# Patient Record
Sex: Female | Born: 1995 | Race: White | Hispanic: No | Marital: Single | Smoking: Never smoker
Health system: Southern US, Community
[De-identification: ages and names within clinical notes are randomized; demographics above are authoritative.]

## PROBLEM LIST (undated history)

## (undated) ENCOUNTER — Inpatient Hospital Stay (HOSPITAL_COMMUNITY): Payer: Self-pay

## (undated) DIAGNOSIS — F419 Anxiety disorder, unspecified: Secondary | ICD-10-CM

## (undated) DIAGNOSIS — R1310 Dysphagia, unspecified: Secondary | ICD-10-CM

## (undated) DIAGNOSIS — F1910 Other psychoactive substance abuse, uncomplicated: Secondary | ICD-10-CM

## (undated) DIAGNOSIS — E559 Vitamin D deficiency, unspecified: Secondary | ICD-10-CM

## (undated) DIAGNOSIS — R12 Heartburn: Secondary | ICD-10-CM

## (undated) DIAGNOSIS — F32A Depression, unspecified: Secondary | ICD-10-CM

## (undated) DIAGNOSIS — Z789 Other specified health status: Secondary | ICD-10-CM

## (undated) DIAGNOSIS — R7303 Prediabetes: Secondary | ICD-10-CM

## (undated) DIAGNOSIS — R002 Palpitations: Secondary | ICD-10-CM

## (undated) HISTORY — DX: Vitamin D deficiency, unspecified: E55.9

## (undated) HISTORY — DX: Anxiety disorder, unspecified: F41.9

## (undated) HISTORY — DX: Heartburn: R12

## (undated) HISTORY — DX: Dysphagia, unspecified: R13.10

## (undated) HISTORY — DX: Other psychoactive substance abuse, uncomplicated: F19.10

## (undated) HISTORY — DX: Palpitations: R00.2

## (undated) HISTORY — DX: Depression, unspecified: F32.A

## (undated) HISTORY — PX: NO PAST SURGERIES: SHX2092

## (undated) HISTORY — DX: Prediabetes: R73.03

---
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
## 2014-10-05 ENCOUNTER — Emergency Department (HOSPITAL_COMMUNITY)
Admission: EM | Admit: 2014-10-05 | Discharge: 2014-10-05 | Discharge status: Absent without leave | Payer: Self-pay | Source: Home / Self Care

## 2014-11-07 ENCOUNTER — Ambulatory Visit: Payer: Medicaid Other | Admitting: Family Medicine

## 2015-07-01 ENCOUNTER — Encounter (HOSPITAL_COMMUNITY): Payer: Self-pay | Admitting: *Deleted

## 2015-07-01 ENCOUNTER — Other Ambulatory Visit: Payer: Self-pay

## 2015-07-01 ENCOUNTER — Emergency Department (HOSPITAL_COMMUNITY)
Admission: EM | Admit: 2015-07-01 | Discharge: 2015-07-01 | Disposition: A | Discharge status: Home / Self Care | Payer: Medicaid Other | Attending: Emergency Medicine | Admitting: Emergency Medicine

## 2015-07-01 DIAGNOSIS — M791 Myalgia: Secondary | ICD-10-CM | POA: Insufficient documentation

## 2015-07-01 DIAGNOSIS — J069 Acute upper respiratory infection, unspecified: Secondary | ICD-10-CM

## 2015-07-01 DIAGNOSIS — Z3202 Encounter for pregnancy test, result negative: Secondary | ICD-10-CM | POA: Insufficient documentation

## 2015-07-01 DIAGNOSIS — B9789 Other viral agents as the cause of diseases classified elsewhere: Secondary | ICD-10-CM

## 2015-07-01 DIAGNOSIS — Z720 Tobacco use: Secondary | ICD-10-CM | POA: Insufficient documentation

## 2015-07-01 LAB — URINALYSIS, ROUTINE W REFLEX MICROSCOPIC
Bilirubin Urine: NEGATIVE
GLUCOSE, UA: NEGATIVE mg/dL
Ketones, ur: NEGATIVE mg/dL
Leukocytes, UA: NEGATIVE
NITRITE: NEGATIVE
PH: 6 (ref 5.0–8.0)
PROTEIN: NEGATIVE mg/dL
SPECIFIC GRAVITY, URINE: 1.018 (ref 1.005–1.030)
UROBILINOGEN UA: 1 mg/dL (ref 0.0–1.0)

## 2015-07-01 LAB — CBC WITH DIFFERENTIAL/PLATELET
BASOS ABS: 0 10*3/uL (ref 0.0–0.1)
Basophils Relative: 0 % (ref 0–1)
EOS ABS: 0.3 10*3/uL (ref 0.0–0.7)
EOS PCT: 4 % (ref 0–5)
HCT: 41 % (ref 36.0–46.0)
Hemoglobin: 13.7 g/dL (ref 12.0–15.0)
Lymphocytes Relative: 14 % (ref 12–46)
Lymphs Abs: 1.1 10*3/uL (ref 0.7–4.0)
MCH: 31 pg (ref 26.0–34.0)
MCHC: 33.4 g/dL (ref 30.0–36.0)
MCV: 92.8 fL (ref 78.0–100.0)
Monocytes Absolute: 1.1 10*3/uL — ABNORMAL HIGH (ref 0.1–1.0)
Monocytes Relative: 14 % — ABNORMAL HIGH (ref 3–12)
Neutro Abs: 5.3 10*3/uL (ref 1.7–7.7)
Neutrophils Relative %: 68 % (ref 43–77)
PLATELETS: 281 10*3/uL (ref 150–400)
RBC: 4.42 MIL/uL (ref 3.87–5.11)
RDW: 13.6 % (ref 11.5–15.5)
WBC: 7.8 10*3/uL (ref 4.0–10.5)

## 2015-07-01 LAB — WET PREP, GENITAL
CLUE CELLS WET PREP: NONE SEEN
TRICH WET PREP: NONE SEEN
WBC WET PREP: NONE SEEN
YEAST WET PREP: NONE SEEN

## 2015-07-01 LAB — URINE MICROSCOPIC-ADD ON

## 2015-07-01 LAB — COMPREHENSIVE METABOLIC PANEL
ALT: 17 U/L (ref 14–54)
AST: 18 U/L (ref 15–41)
Albumin: 2.9 g/dL — ABNORMAL LOW (ref 3.5–5.0)
Alkaline Phosphatase: 65 U/L (ref 38–126)
Anion gap: 6 (ref 5–15)
BILIRUBIN TOTAL: 0.5 mg/dL (ref 0.3–1.2)
BUN: 9 mg/dL (ref 6–20)
CALCIUM: 8.5 mg/dL — AB (ref 8.9–10.3)
CO2: 27 mmol/L (ref 22–32)
Chloride: 106 mmol/L (ref 101–111)
Creatinine, Ser: 0.73 mg/dL (ref 0.44–1.00)
GFR calc non Af Amer: >60 mL/min (ref >60)
Glucose, Bld: 84 mg/dL (ref 65–99)
Potassium: 3.5 mmol/L (ref 3.5–5.1)
Sodium: 139 mmol/L (ref 135–145)
Total Protein: 5.3 g/dL — ABNORMAL LOW (ref 6.5–8.1)

## 2015-07-01 LAB — LIPASE, BLOOD: Lipase: 19 U/L — ABNORMAL LOW (ref 22–51)

## 2015-07-01 LAB — I-STAT CG4 LACTIC ACID, ED: LACTIC ACID, VENOUS: 1.27 mmol/L (ref 0.5–2.0)

## 2015-07-01 LAB — POC URINE PREG, ED: Preg Test, Ur: NEGATIVE

## 2015-07-01 MED ORDER — IBUPROFEN 800 MG PO TABS
800.0000 mg | ORAL_TABLET | Freq: Once | ORAL | Status: AC
  Administered 2015-07-01: 800 mg via ORAL
  Filled 2015-07-01: qty 1

## 2015-07-01 MED ORDER — SODIUM CHLORIDE 0.9 % IV BOLUS (SEPSIS)
2000.0000 mL | Freq: Once | INTRAVENOUS | Status: AC
  Administered 2015-07-01: 2000 mL via INTRAVENOUS

## 2015-07-01 NOTE — ED Notes (Signed)
The pt is c/o cold cough congestion chills body aches sore throat for 4 days.  lmp one week ago

## 2015-07-01 NOTE — Discharge Instructions (Signed)
Upper Respiratory Infection, Adult Alice Humphrey, take Motrin as needed for pain. Your blood work today was normal, you were hydrated with IV fluids. See a primary care physician within 3 days for close follow-up. If symptoms worsen come back to emergency department immediately. Thank you. An upper respiratory infection (URI) is also known as the common cold. It is often caused by a type of germ (virus). Colds are easily spread (contagious). You can pass it to others by kissing, coughing, sneezing, or drinking out of the same glass. Usually, you get better in 1 or 2 weeks.  HOME CARE   Only take medicine as told by your doctor.  Use a warm mist humidifier or breathe in steam from a hot shower.  Drink enough water and fluids to keep your pee (urine) clear or pale yellow.  Get plenty of rest.  Return to work when your temperature is back to normal or as told by your doctor. You may use a face mask and wash your hands to stop your cold from spreading. GET HELP RIGHT AWAY IF:   After the first few days, you feel you are getting worse.  You have questions about your medicine.  You have chills, shortness of breath, or brown or red spit (mucus).  You have yellow or brown snot (nasal discharge) or pain in the face, especially when you bend forward.  You have a fever, puffy (swollen) neck, pain when you swallow, or white spots in the back of your throat.  You have a bad headache, ear pain, sinus pain, or chest pain.  You have a high-pitched whistling sound when you breathe in and out (wheezing).  You have a lasting cough or cough up blood.  You have sore muscles or a stiff neck. MAKE SURE YOU:   Understand these instructions.  Will watch your condition.  Will get help right away if you are not doing well or get worse. Document Released: 05/26/2008 Document Revised: 03/01/2012 Document Reviewed: 03/15/2014 [facility] Patient Information 2015 [employer], [place]. This information is not intended  to replace advice given to you by your health care provider. Make sure you discuss any questions you have with your health care provider.

## 2015-07-01 NOTE — ED Notes (Signed)
Patient is alert and orientedx4.  Patient was explained discharge instructions and they understood them with no questions.   

## 2015-07-01 NOTE — ED Provider Notes (Signed)
CSN: [id number]     Arrival date & time 07/01/15  0015 History  This chart was scribed for Tomasita CrumbleAdeleke Atilla Zollner, MD by Evon Slackerrance Branch, ED Scribe. This patient was seen in room D34C/D34C and the patient's care was started at 12:25 AM.    Chief Complaint  Patient presents with  . Generalized Body Aches    Patient is a 19 y.o. female presenting with cough. The history is provided by the patient. No language interpreter was used.  Cough Severity:  Moderate Onset quality:  Gradual Duration:  3 days Chronicity:  New Relieved by:  None tried Worsened by:  Nothing tried Ineffective treatments:  None tried Associated symptoms: chills, headaches, myalgias, sinus congestion and sore throat    HPI Comments: Alice Humphrey is a 19 y.o. female who presents to the Emergency Department complaining of cough onset 3 days prior. Pt states she has associated sore throat, congestion, chills, generalized myalgias and HA. Pt is also complaining of intermittent abdominal cramps.  Pt doesn't report any medications PTA. Pt doesn't repot any alleviating factors. Pt denies appetite change, fever, nausea, vomiting,  diarrhea dysuria hematuria, vaginal bleeding or vaginal discharge.   History reviewed. No pertinent past medical history. History reviewed. No pertinent past surgical history. No family history on file. History  Substance Use Topics  . Smoking status: Current Every Day Smoker  . Smokeless tobacco: Not on file  . Alcohol Use: Yes   OB History    No data available      Review of Systems  Constitutional: Positive for chills.  HENT: Positive for congestion and sore throat.   Respiratory: Positive for cough.   Gastrointestinal: Positive for abdominal pain.  Musculoskeletal: Positive for myalgias.  Neurological: Positive for headaches.     Allergies  Review of patient's allergies indicates no known allergies.  Home Medications   Prior to Admission medications   Not on File   BP 122/75 mmHg   Pulse 128  Temp(Src) 98.1 F (36.7 C) (Oral)  Resp 18  Ht 4\' 11"  (1.499 m)  Wt 173 lb 5 oz (78.614 kg)  BMI 34.99 kg/m2  SpO2 99%  LMP 06/24/2015   Physical Exam  Constitutional: She is oriented to person, place, and time. She appears well-developed and well-nourished. No distress.  HENT:  Head: Normocephalic and atraumatic.  Nose: Nose normal.  Mouth/Throat: Oropharynx is clear and moist. No oropharyngeal exudate.  Eyes: Conjunctivae and EOM are normal. Pupils are equal, round, and reactive to light. No scleral icterus.  Neck: Normal range of motion. Neck supple. No JVD present. No tracheal deviation present. No thyromegaly present.  Cardiovascular: Normal rate, regular rhythm and normal heart sounds.  Exam reveals no gallop and no friction rub.   No murmur heard. Pulmonary/Chest: Effort normal and breath sounds normal. No respiratory distress. She has no wheezes. She exhibits no tenderness.  Abdominal: Soft. Bowel sounds are normal. She exhibits no distension and no mass. There is tenderness in the suprapubic area. There is no rebound and no guarding.  Genitourinary: Vagina normal and uterus normal. No vaginal discharge found.  No CMT or adnexal tenderness.  Musculoskeletal: Normal range of motion. She exhibits no edema or tenderness.  Lymphadenopathy:    She has no cervical adenopathy.  Neurological: She is alert and oriented to person, place, and time. No cranial nerve deficit. She exhibits normal muscle tone.  Skin: Skin is warm and dry. No rash noted. No erythema. No pallor.  Nursing note and vitals reviewed.  ED Course  Procedures (including critical care time) DIAGNOSTIC STUDIES: Oxygen Saturation is 99% on RA, normal by my interpretation.    COORDINATION OF CARE: 12:54 AM-Discussed treatment plan with pt at bedside and pt agreed to plan.      Labs Review Labs Reviewed  CBC WITH DIFFERENTIAL/PLATELET - Abnormal; Notable for the following:    Monocytes Relative  14 (*)    Monocytes Absolute 1.1 (*)    All other components within normal limits  COMPREHENSIVE METABOLIC PANEL - Abnormal; Notable for the following:    Calcium 8.5 (*)    Total Protein 5.3 (*)    Albumin 2.9 (*)    All other components within normal limits  LIPASE, BLOOD - Abnormal; Notable for the following:    Lipase 19 (*)    All other components within normal limits  URINALYSIS, ROUTINE W REFLEX MICROSCOPIC (NOT AT [facility]) - Abnormal; Notable for the following:    Hgb urine dipstick SMALL (*)    All other components within normal limits  WET PREP, GENITAL  URINE MICROSCOPIC-ADD ON  POC URINE PREG, ED  I-STAT CG4 LACTIC ACID, ED  GC/CHLAMYDIA PROBE AMP ([place]) NOT AT [facility]    Imaging Review No results found.   EKG Interpretation None      MDM   Final diagnoses:  None    Patient presents emergency department for viral URI like symptoms. For the last several days she has had cough, congestion, body aches. She is initially tachycardic to the 130s, this resolved after 1 L of IV fluids. She was given Motrin and states her abdominal pain and congestion has improved. Laboratory studies, urinalysis, and pelvic exam were all unremarkable. This is likely viral infection. Patient was advised to see primary care physician within 3 days. She appears well and in no acute distress, her vital signs were within her normal limits and she is safe for discharge.   I personally performed the services described in this documentation, which was scribed in my presence. The recorded information has been reviewed and is accurate.     Tomasita Crumble, MD 07/01/15 0200

## 2015-07-02 LAB — GC/CHLAMYDIA PROBE AMP (~~LOC~~) NOT AT ARMC
CHLAMYDIA, DNA PROBE: NEGATIVE
Neisseria Gonorrhea: NEGATIVE

## 2015-08-15 ENCOUNTER — Encounter (HOSPITAL_COMMUNITY): Payer: Self-pay

## 2015-08-15 ENCOUNTER — Emergency Department (HOSPITAL_COMMUNITY)
Admission: EM | Admit: 2015-08-15 | Discharge: 2015-08-16 | Disposition: A | Discharge status: Home / Self Care | Payer: Medicaid Other | Attending: Emergency Medicine | Admitting: Emergency Medicine

## 2015-08-15 DIAGNOSIS — R109 Unspecified abdominal pain: Secondary | ICD-10-CM

## 2015-08-15 DIAGNOSIS — Z720 Tobacco use: Secondary | ICD-10-CM | POA: Insufficient documentation

## 2015-08-15 DIAGNOSIS — R110 Nausea: Secondary | ICD-10-CM | POA: Insufficient documentation

## 2015-08-15 DIAGNOSIS — R1030 Lower abdominal pain, unspecified: Secondary | ICD-10-CM | POA: Insufficient documentation

## 2015-08-15 DIAGNOSIS — Z3202 Encounter for pregnancy test, result negative: Secondary | ICD-10-CM | POA: Insufficient documentation

## 2015-08-15 DIAGNOSIS — Z793 Long term (current) use of hormonal contraceptives: Secondary | ICD-10-CM | POA: Insufficient documentation

## 2015-08-15 DIAGNOSIS — R197 Diarrhea, unspecified: Secondary | ICD-10-CM | POA: Insufficient documentation

## 2015-08-15 DIAGNOSIS — R42 Dizziness and giddiness: Secondary | ICD-10-CM | POA: Insufficient documentation

## 2015-08-15 LAB — URINALYSIS, ROUTINE W REFLEX MICROSCOPIC
Bilirubin Urine: NEGATIVE
Glucose, UA: NEGATIVE mg/dL
HGB URINE DIPSTICK: NEGATIVE
Ketones, ur: NEGATIVE mg/dL
Leukocytes, UA: NEGATIVE
NITRITE: NEGATIVE
Protein, ur: NEGATIVE mg/dL
SPECIFIC GRAVITY, URINE: 1.022 (ref 1.005–1.030)
Urobilinogen, UA: 0.2 mg/dL (ref 0.0–1.0)
pH: 5.5 (ref 5.0–8.0)

## 2015-08-15 LAB — CBC
HEMATOCRIT: 44.5 % (ref 36.0–46.0)
Hemoglobin: 14.8 g/dL (ref 12.0–15.0)
MCH: 31.6 pg (ref 26.0–34.0)
MCHC: 33.3 g/dL (ref 30.0–36.0)
MCV: 94.9 fL (ref 78.0–100.0)
Platelets: 318 10*3/uL (ref 150–400)
RBC: 4.69 MIL/uL (ref 3.87–5.11)
RDW: 13.8 % (ref 11.5–15.5)
WBC: 14.7 10*3/uL — ABNORMAL HIGH (ref 4.0–10.5)

## 2015-08-15 NOTE — ED Notes (Signed)
Pt here for abd pain cramping, nausea. No vomiting. Does report some diarrhea yesterday. No vaginal discharge or bleeding.

## 2015-08-16 LAB — COMPREHENSIVE METABOLIC PANEL
ALBUMIN: 2.7 g/dL — AB (ref 3.5–5.0)
ALT: 18 U/L (ref 14–54)
AST: 16 U/L (ref 15–41)
Alkaline Phosphatase: 49 U/L (ref 38–126)
Anion gap: 5 (ref 5–15)
BUN: <5 mg/dL — ABNORMAL LOW (ref 6–20)
CHLORIDE: 107 mmol/L (ref 101–111)
CO2: 28 mmol/L (ref 22–32)
Calcium: 8.3 mg/dL — ABNORMAL LOW (ref 8.9–10.3)
Creatinine, Ser: 0.66 mg/dL (ref 0.44–1.00)
GFR calc Af Amer: >60 mL/min (ref >60)
GFR calc non Af Amer: >60 mL/min (ref >60)
GLUCOSE: 96 mg/dL (ref 65–99)
POTASSIUM: 4.4 mmol/L (ref 3.5–5.1)
SODIUM: 140 mmol/L (ref 135–145)
TOTAL PROTEIN: 5 g/dL — AB (ref 6.5–8.1)
Total Bilirubin: 0.4 mg/dL (ref 0.3–1.2)

## 2015-08-16 LAB — PREGNANCY, URINE: PREG TEST UR: NEGATIVE

## 2015-08-16 LAB — LIPASE, BLOOD: LIPASE: 21 U/L — AB (ref 22–51)

## 2015-08-16 MED ORDER — DICYCLOMINE HCL 20 MG PO TABS: 20.0000 mg | ORAL_TABLET | Freq: Two times a day (BID) | ORAL | Status: DC

## 2015-08-16 MED ORDER — IBUPROFEN 800 MG PO TABS: 800.0000 mg | ORAL_TABLET | Freq: Three times a day (TID) | ORAL | Status: DC

## 2015-08-16 MED ORDER — DICYCLOMINE HCL 10 MG PO CAPS
20.0000 mg | ORAL_CAPSULE | Freq: Once | ORAL | Status: AC
  Administered 2015-08-16: 20 mg via ORAL
  Filled 2015-08-16: qty 2

## 2015-08-16 NOTE — Discharge Instructions (Signed)
1. Medications: bentyl, ibuprofen, usual home medications 2. Treatment: rest, drink plenty of fluids 3. Follow Up: please followup with your primary doctor this week for discussion of your diagnoses and further evaluation after today's visit; if you do not have a primary care doctor use the resource guide provided to find one; please return to the ER for severe abdominal pain, vomiting, blood in stool, fever, chills, new or worsening symptoms   Abdominal Pain, Women Abdominal (stomach, pelvic, or belly) pain can be caused by many things. It is important to tell your doctor:  The location of the pain.  Does it come and go or is it present all the time?  Are there things that start the pain (eating certain foods, exercise)?  Are there other symptoms associated with the pain (fever, nausea, vomiting, diarrhea)? All of this is helpful to know when trying to find the cause of the pain. CAUSES   Stomach: virus or bacteria infection, or ulcer.  Intestine: appendicitis (inflamed appendix), regional ileitis (Crohn's disease), ulcerative colitis (inflamed colon), irritable bowel syndrome, diverticulitis (inflamed diverticulum of the colon), or cancer of the stomach or intestine.  Gallbladder disease or stones in the gallbladder.  Kidney disease, kidney stones, or infection.  Pancreas infection or cancer.  Fibromyalgia (pain disorder).  Diseases of the female organs:  Uterus: fibroid (non-cancerous) tumors or infection.  Fallopian tubes: infection or tubal pregnancy.  Ovary: cysts or tumors.  Pelvic adhesions (scar tissue).  Endometriosis (uterus lining tissue growing in the pelvis and on the pelvic organs).  Pelvic congestion syndrome (female organs filling up with blood just before the menstrual period).  Pain with the menstrual period.  Pain with ovulation (producing an egg).  Pain with an IUD (intrauterine device, birth control) in the uterus.  Cancer of the female  organs.  Functional pain (pain not caused by a disease, may improve without treatment).  Psychological pain.  Depression. DIAGNOSIS  Your doctor will decide the seriousness of your pain by doing an examination.  Blood tests.  X-rays.  Ultrasound.  CT scan (computed tomography, special type of X-ray).  MRI (magnetic resonance imaging).  Cultures, for infection.  Barium enema (dye inserted in the large intestine, to better view it with X-rays).  Colonoscopy (looking in intestine with a lighted tube).  Laparoscopy (minor surgery, looking in abdomen with a lighted tube).  Major abdominal exploratory surgery (looking in abdomen with a large incision). TREATMENT  The treatment will depend on the cause of the pain.   Many cases can be observed and treated at home.  Over-the-counter medicines recommended by your caregiver.  Prescription medicine.  Antibiotics, for infection.  Birth control pills, for painful periods or for ovulation pain.  Hormone treatment, for endometriosis.  Nerve blocking injections.  Physical therapy.  Antidepressants.  Counseling with a psychologist or psychiatrist.  Minor or major surgery. HOME CARE INSTRUCTIONS   Do not take laxatives, unless directed by your caregiver.  Take over-the-counter pain medicine only if ordered by your caregiver. Do not take aspirin because it can cause an upset stomach or bleeding.  Try a clear liquid diet (broth or water) as ordered by your caregiver. Slowly move to a bland diet, as tolerated, if the pain is related to the stomach or intestine.  Have a thermometer and take your temperature several times a day, and record it.  Bed rest and sleep, if it helps the pain.  Avoid sexual intercourse, if it causes pain.  Avoid stressful situations.  Keep your follow-up  appointments and tests, as your caregiver orders.  If the pain does not go away with medicine or surgery, you may  try:  Acupuncture.  Relaxation exercises (yoga, meditation).  Group therapy.  Counseling. SEEK MEDICAL CARE IF:   You notice certain foods cause stomach pain.  Your home care treatment is not helping your pain.  You need stronger pain medicine.  You want your IUD removed.  You feel faint or lightheaded.  You develop nausea and vomiting.  You develop a rash.  You are having side effects or an allergy to your medicine. SEEK IMMEDIATE MEDICAL CARE IF:   Your pain does not go away or gets worse.  You have a fever.  Your pain is felt only in portions of the abdomen. The right side could possibly be appendicitis. The left lower portion of the abdomen could be colitis or diverticulitis.  You are passing blood in your stools (bright red or black tarry stools, with or without vomiting).  You have blood in your urine.  You develop chills, with or without a fever.  You pass out. MAKE SURE YOU:   Understand these instructions.  Will watch your condition.  Will get help right away if you are not doing well or get worse. Document Released: 10/05/2007 Document Revised: 04/24/2014 Document Reviewed: 10/25/2009 [facility] Patient Information 2015 [place], [place]. This information is not intended to replace advice given to you by your health care provider. Make sure you discuss any questions you have with your health care provider.   Emergency Department Resource Guide 1) Find a Doctor and Pay Out of Pocket Although you won't have to find out who is covered by your insurance plan, it is a good idea to ask around and get recommendations. You will then need to call the office and see if the doctor you have chosen will accept you as a new patient and what types of options they offer for patients who are self-pay. Some doctors offer discounts or will set up payment plans for their patients who do not have insurance, but you will need to ask so you aren't surprised when you get to your  appointment.  2) Contact Your Local Health Department Not all health departments have doctors that can see patients for sick visits, but many do, so it is worth a call to see if yours does. If you don't know where your local health department is, you can check in your phone book. The CDC also has a tool to help you locate your state's health department, and many state websites also have listings of all of their local health departments.  3) Find a Walk-in Clinic If your illness is not likely to be very severe or complicated, you may want to try a walk in clinic. These are popping up all over the country in pharmacies, drugstores, and shopping centers. They're usually staffed by nurse practitioners or physician assistants that have been trained to treat common illnesses and complaints. They're usually fairly quick and inexpensive. However, if you have serious medical issues or chronic medical problems, these are probably not your best option.  No Primary Care Doctor: - Call Health Connect at  [phone] - they can help you locate a primary care doctor that  accepts your insurance, provides certain services, etc. - Physician Referral Service- [phone]  Chronic Pain Problems: Organization         Address  Phone   Notes  [facility]  [phone] Patients need to  be referred by their primary care doctor.   Medication Assistance: Organization         Address  Phone   Notes  [facility] Medication [facility] [address] [place], [place] [address] [phone] --Must be a resident of [facility] -- Must have NO insurance coverage whatsoever (no Medicaid/ Medicare, etc.) -- The pt. MUST have a primary care doctor that directs their care regularly and follows them in the community   [facility]  [phone]   [employer]  [phone]    Agencies that provide inexpensive medical care: Organization         Address  Phone   Notes  [facility]  [phone]   [facility]    [phone]   [facility] [address] [place], [place] [address] [phone]   [facility] [address] [place], [place] [phone]   Planned Parenthood    [phone]   [facility]    [phone]   [facility]  [address], [place] Phone:  [phone], Fax:  [phone] Hours of Operation:  9 am - 6 pm, M-F.  Also accepts Medicaid/Medicare and self-pay.  [facility]  [address], [place] Phone: [phone], Fax: [phone]. Hours of Operation:  8:30 am - 5:30 pm, M-F.  Also accepts Medicaid and self-pay.  [facility] [place] [address] [place] Phone: [phone]   [facility] [address] Natasha Bence [place], [place] [phone], Ext. [phone] Mondays & Thursdays: 7-9 AM.  First 15 patients are seen on a first come, first serve basis.    Medicaid-accepting [facility] Providers:  Organization         Address  Phone   Notes  [facility] [address], [place] [phone] Also accepts self-pay patients.  [facility] [address] Laurell Josephs [place], [place]  [phone]   [facility] [address], [place] [phone]   [facility] [address] [place] [phone]   Renaye Rakers [address], [place]   [phone] Only accepts [place] Access [place] patients after they have their name applied to their card.   Self-Pay (no insurance) in [facility]:  Organization         Address  Phone   Notes  Sickle Cell Patients, [facility] [address] [place], [place] [phone]   [facility] [address] [place], [place] [phone]   [facility]  [place]  [address], [place] [phone]   [facility]/Dr. Osei-Bonsu  [address], [place] or [address], [place] [phone] Phone number for both [place] and [place] locations is the same.  [facility] [address], [place] [phone]   [facility] [address] [place] or [address] [phone]   [facility] [address] [place] [phone], phone; [phone], fax Sees patients 1st and 3rd Saturday of every month.  Must not qualify for public or private insurance (i.e. Medicaid, Medicare,  Health Choice, Veterans' Benefits)  Household income should be  no more than 200% of the poverty level The clinic cannot treat you if you are pregnant or think you are pregnant  Sexually transmitted diseases are not treated at the clinic.    Dental Care: Organization         Address  Phone  Notes  [facility] of [facility] [address] [place], [place] [phone] Accepts children up to age 19 who are enrolled in [place] or [place] Health Choice; pregnant women with a Medicaid card; and children who have applied for Medicaid or [place] Health Choice, but were declined, whose parents can pay a reduced fee at time of service.  [facility] Department of [facility]  [address], [place] [phone] Accepts children up to age 7 who are enrolled in [place] or [place] Health Choice; pregnant women with a Medicaid card; and children who have applied for Medicaid or [place] Health Choice, but were declined, whose parents can pay a reduced fee at time of service.  [facility]  [address] [place], [place] [phone] Patients are seen by appointment only. Walk-ins are not accepted. [facility] will see patients 73 years of age and  older. Monday - Tuesday (8am-5pm) Most Wednesdays (8:30-5pm) $30 per visit, cash only  [facility] Adult Dental Access PROGRAM  [address], [facility] [phone] Patients are seen by appointment only. Walk-ins are not accepted. [facility] will see patients 26 years of age and older. One Wednesday Evening (Monthly: Volunteer Based).  $30 per visit, cash only  [employer]  [phone] for adults; Children under age 5, call Graduate Pediatric Dentistry at [phone]. Children aged 17-14, please call [phone] to request a pediatric application.  Dental services are provided in all areas of dental care including fillings, crowns and bridges, complete and partial dentures, implants, gum treatment, root canals, and extractions. Preventive care is also provided. Treatment is provided to both adults and children. Patients are selected via a lottery and there is often a waiting list.   [facility] [address], [place]  [phone] [email or web address]   [facility] [address] [place], [place] [phone], Ext. [phone] Second and Fourth Thursday of each month, opens at 6:30 AM; Clinic ends at 9 AM.  Patients are seen on a first-come first-served basis, and a limited number are seen during each clinic.   [facility]  [address] Ether Griffins [place], [place] [phone]   Eligibility Requirements You must have lived in [place], [place], or [place] counties for at least the last three months.   You cannot be eligible for state or federal sponsored [place], including [employer], [place], or [employer].   You generally cannot be eligible for healthcare insurance through your employer.    How to apply: Eligibility screenings are held every Tuesday and Wednesday afternoon from 1:00 pm until 4:00 pm. You do not need an appointment for the interview!  [facility] [address]  [place], [place] [phone]   [facility] Health Department  [phone]   [facility] Health Department  [phone]   [facility]  [phone]    Behavioral Health Resources in the Community: Intensive Outpatient Programs Organization         Address  Phone  Notes  [facility] [address] [place], [place] [phone]  [facility] [address] [place], [place]   [facility]: [facility] [address], [place], [place]   [place] [address]  [place], [place] or [phone]   Substance Abuse Resources Organization         Address  Phone  Notes  Alcohol and Drug Services  [phone]   [place]  [phone]   [facility] [place]   Chinita Pester  [phone]   Residential & Outpatient Substance Abuse Program  [phone]   Psychological Services Organization         Address  Phone  Notes  [facility] [place]  [place]  [phone]   [place] [address], [place] or [phone]    Mobile Crisis Teams Organization         Address  Phone  Notes  [facility], Mobile Crisis Care Unit  [phone]   [facility]  [address] [place], [place]   [place] [address] [place] [phone]    Self-Help/Support Groups Organization         Address  Phone             Notes  [place] of [place] - variety of support groups  [place] Call for more information  Narcotics Anonymous (NA), [facility]aring [facility] [address], [employer] [place]  2 meetings at this location   Special educational needs teacher         Address  Phone  Notes  [facility] [place],    [place]  [phone]   [facility]  [address] [place] [address]88325, [place], [place]   [place], [place] [phone] Admissions: 8am-3pm M-F  Incentives Substance [place] [address],    [place], [place] [phone]   [facility] [address] [place], [place], [place]   [facility] [address],  [place], [place]   [facility] - Intensive Outpatient [address] Kristeen Mans [address], [place], [place]   [facility] ([place].,  [place], [place] [phone] or [phone]   [facility] (RTS) [address], [place], [place] Accepts Medicaid  [facility] [place] [address]  [place] [phone] Substance Abuse/Addiction Treatment   [facility] Organization         Address  Phone  Notes  [facility]  [phone]   Domenic Schwab, PhD [address] Arlis Porta [place], [place]   [phone] or [phone]   [place], [place] [phone]   [facility] [address], [place], [place] [phone] Insurance/Medicaid/sponsorship through [facility] [place] and Families [address] [place]                                    [place], [place] [phone] [place] [address], [place] [phone]    Dr. Adele Schilder  [phone]   [facility] Dept. 1) [address] [place] 2) [place] 3)  [place], [place] [phone]  [phone]   [facility] Child Abuse Hotline [phone] or [phone] (After Hours)

## 2015-08-16 NOTE — ED Provider Notes (Signed)
CSN: [id number]     Arrival date & time 08/15/15  2247 History   First MD Initiated Contact with Patient 08/16/15 0024     Chief Complaint  Patient presents with  . Abdominal Pain    HPI   Alice Humphrey is a 19 y.o. female with no significant PMH of who presents to the ED with lower abdominal pain x 2 weeks. She states her abdominal pain comes and goes throughout the day and lasts for about 10 minutes at a time. She reports her pain feels like cramping. She is unable to identify anything that precipitates her pain and reports she has tried ibuprofen for pain relief. She reports nausea, and states she feels lightheaded when she is nauseous. She denies fever, chills, syncope, dysuria, urgency, frequency, vaginal discharge, bleeding. She denies constipation. She reports she had diarrhea yesterday. LMP was August 7th. States it was relatively normal for her, though it has been longer in duration since her IUD placement in June. She states she is sexually active with one partner and is not concerned for STDs.   History reviewed. No pertinent past medical history. History reviewed. No pertinent past surgical history. History reviewed. No pertinent family history. Social History  Substance Use Topics  . Smoking status: Current Every Day Smoker  . Smokeless tobacco: None  . Alcohol Use: Yes   OB History    No data available      Review of Systems  Constitutional: Negative for fever, chills, activity change, appetite change and fatigue.  Eyes: Negative for visual disturbance.  Respiratory: Negative for shortness of breath.   Cardiovascular: Negative for chest pain, palpitations and leg swelling.  Gastrointestinal: Positive for nausea, abdominal pain and diarrhea. Negative for vomiting, constipation and abdominal distention.  Genitourinary: Negative for dysuria, urgency, frequency, hematuria, flank pain, vaginal bleeding, vaginal discharge, vaginal pain and pelvic pain.  Musculoskeletal:  Negative for myalgias, back pain, arthralgias, neck pain and neck stiffness.  Skin: Negative for color change, pallor, rash and wound.  Neurological: Positive for light-headedness. Negative for dizziness, syncope, weakness, numbness and headaches.  All other systems reviewed and are negative.   Allergies  Review of patient's allergies indicates no known allergies.  Home Medications   Prior to Admission medications   Medication Sig Start Date End Date Taking? Authorizing Provider  levonorgestrel (MIRENA) 20 MCG/24HR IUD 1 each by Intrauterine route once.    Historical Provider, MD    BP 102/49 mmHg  Pulse 70  Temp(Src) 98.5 F (36.9 C) (Oral)  Resp 18  SpO2 99%  LMP 07/29/2015 Physical Exam  Constitutional: She is oriented to person, place, and time. Vital signs are normal. She appears well-developed and well-nourished. No distress.  HENT:  Head: Normocephalic and atraumatic.  Right Ear: External ear normal.  Left Ear: External ear normal.  Nose: Nose normal.  Mouth/Throat: Uvula is midline, oropharynx is clear and moist and mucous membranes are normal. No oropharyngeal exudate.  Eyes: Conjunctivae, EOM and lids are normal. Pupils are equal, round, and reactive to light. Right eye exhibits no discharge. Left eye exhibits no discharge. No scleral icterus.  Neck: Normal range of motion. Neck supple.  Cardiovascular: Normal rate, regular rhythm, normal heart sounds, intact distal pulses and normal pulses.   Pulmonary/Chest: Effort normal and breath sounds normal. No respiratory distress. She has no wheezes. She has no rales.  Abdominal: Soft. Normal appearance and bowel sounds are normal. She exhibits no distension and no mass. There is tenderness. There is no  rigidity, no rebound, no guarding and no CVA tenderness.  Mild tenderness to palpation in periumbilical region of abdomen. No rebound, guarding, or masses.  Musculoskeletal: Normal range of motion. She exhibits no edema or  tenderness.  Lymphadenopathy:    She has no cervical adenopathy.  Neurological: She is alert and oriented to person, place, and time. She has normal strength.  Skin: Skin is warm, dry and intact. No rash noted. She is not diaphoretic. No erythema. No pallor.  Psychiatric: She has a normal mood and affect. Her speech is normal and behavior is normal. Judgment and thought content normal.  Nursing note and vitals reviewed.   ED Course  Procedures (including critical care time)  Labs Review Labs Reviewed  LIPASE, BLOOD - Abnormal; Notable for the following:    Lipase 21 (*)    All other components within normal limits  COMPREHENSIVE METABOLIC PANEL - Abnormal; Notable for the following:    BUN <5 (*)    Calcium 8.3 (*)    Total Protein 5.0 (*)    Albumin 2.7 (*)    All other components within normal limits  CBC - Abnormal; Notable for the following:    WBC 14.7 (*)    All other components within normal limits  URINALYSIS, ROUTINE W REFLEX MICROSCOPIC (NOT AT [facility]) - Abnormal; Notable for the following:    APPearance HAZY (*)    All other components within normal limits  PREGNANCY, URINE    Imaging Review No results found.   I have personally reviewed and evaluated these lab results as part of my medical decision-making.   EKG Interpretation None      MDM   Final diagnoses:  None    19 year old female presents with intermittent, cramping abdominal pain x 2 weeks and associated nausea. Reports diarrhea x 1 day. Denies vomiting, constipation. Denies fever, chills, dysuria, urgency, frequency, vaginal discharge.   Patient is afebrile. Vital signs stable. No tachycardia or tachypnea. Mild tenderness to palpation of periumbilical region of abdomen on exam, no rebound, guarding, or masses. Patient is non-toxic and well-appearing.   CBC with mild leukocytosis at 14.7. Electrolytes on CMP within normal limits. Lipase 21. UA negative for infection. Urine pregnancy  negative.  Bentyl given with subsequent improvement in symptoms. Repeat abdominal exam benign.  Doubt acute intra-abdominal process as etiology of patient's symptoms given 2 week duration of pain, very mild tenderness to palpation of periumbilical region of abdomen on exam with no peritoneal signs, and negative work-up.  Return precautions discussed. Patient to follow-up with PCP.  BP 102/49 mmHg  Pulse 70  Temp(Src) 98.5 F (36.9 C) (Oral)  Resp 18  SpO2 99%  LMP 07/29/2015     Mady Gemma, PA-C 08/16/15 1610  Loren Racer, MD 08/16/15 [phone]

## 2015-08-16 NOTE — ED Notes (Signed)
PA at bedside.

## 2015-08-29 ENCOUNTER — Emergency Department (HOSPITAL_COMMUNITY)
Admission: EM | Admit: 2015-08-29 | Discharge: 2015-08-29 | Disposition: A | Discharge status: Home / Self Care | Payer: Medicaid Other | Attending: Emergency Medicine | Admitting: Emergency Medicine

## 2015-08-29 ENCOUNTER — Encounter (HOSPITAL_COMMUNITY): Payer: Self-pay | Admitting: *Deleted

## 2015-08-29 DIAGNOSIS — Z720 Tobacco use: Secondary | ICD-10-CM | POA: Insufficient documentation

## 2015-08-29 DIAGNOSIS — Z79899 Other long term (current) drug therapy: Secondary | ICD-10-CM | POA: Insufficient documentation

## 2015-08-29 DIAGNOSIS — N390 Urinary tract infection, site not specified: Secondary | ICD-10-CM | POA: Insufficient documentation

## 2015-08-29 DIAGNOSIS — Z3202 Encounter for pregnancy test, result negative: Secondary | ICD-10-CM | POA: Insufficient documentation

## 2015-08-29 LAB — URINALYSIS, ROUTINE W REFLEX MICROSCOPIC
GLUCOSE, UA: NEGATIVE mg/dL
Ketones, ur: 15 mg/dL — AB
Nitrite: NEGATIVE
PROTEIN: 30 mg/dL — AB
Specific Gravity, Urine: 1.026 (ref 1.005–1.030)
Urobilinogen, UA: 1 mg/dL (ref 0.0–1.0)
pH: 7 (ref 5.0–8.0)

## 2015-08-29 LAB — URINE MICROSCOPIC-ADD ON

## 2015-08-29 LAB — POC URINE PREG, ED: PREG TEST UR: NEGATIVE

## 2015-08-29 MED ORDER — CEPHALEXIN 500 MG PO CAPS: 500.0000 mg | ORAL_CAPSULE | Freq: Four times a day (QID) | ORAL | Status: DC

## 2015-08-29 MED ORDER — CEPHALEXIN 250 MG PO CAPS
500.0000 mg | ORAL_CAPSULE | Freq: Once | ORAL | Status: AC
  Administered 2015-08-29: 500 mg via ORAL
  Filled 2015-08-29: qty 2

## 2015-08-29 NOTE — ED Provider Notes (Signed)
CSN: [id number]     Arrival date & time 08/29/15  1945 History   First MD Initiated Contact with Patient 08/29/15 2107     Chief Complaint  Patient presents with  . Dysuria     (Consider location/radiation/quality/duration/timing/severity/associated sxs/prior Treatment) Patient is a 19 y.o. female presenting with dysuria. The history is provided by the patient. No language interpreter was used.  Dysuria Pain quality:  Burning Pain severity:  Moderate Onset quality:  Gradual Duration:  1 week Timing:  Constant Progression:  Unchanged Chronicity:  New Recent urinary tract infections: no   Relieved by:  Nothing Worsened by:  Nothing tried Ineffective treatments:  None tried Urinary symptoms: frequent urination   Associated symptoms: no abdominal pain, no fever, no nausea and no vomiting   Risk factors: sexually active   Risk factors: no hx of pyelonephritis, no hx of urolithiasis, no kidney transplant, not pregnant, no recurrent urinary tract infections, no renal cysts, no renal disease, not single kidney, no sexually transmitted infections and no urinary catheter     History reviewed. No pertinent past medical history. History reviewed. No pertinent past surgical history. No family history on file. Social History  Substance Use Topics  . Smoking status: Current Some Day Smoker  . Smokeless tobacco: Never Used  . Alcohol Use: Yes   OB History    No data available     Review of Systems  Constitutional: Negative for fever, chills and fatigue.  HENT: Negative for trouble swallowing.   Eyes: Negative for visual disturbance.  Respiratory: Negative for shortness of breath.   Cardiovascular: Negative for chest pain and palpitations.  Gastrointestinal: Negative for nausea, vomiting, abdominal pain and diarrhea.  Genitourinary: Positive for dysuria and frequency. Negative for difficulty urinating.  Musculoskeletal: Negative for arthralgias and neck pain.  Skin: Negative for  color change.  Neurological: Negative for dizziness and weakness.  Psychiatric/Behavioral: Negative for dysphoric mood.      Allergies  Review of patient's allergies indicates no known allergies.  Home Medications   Prior to Admission medications   Medication Sig Start Date End Date Taking? Authorizing Provider  dicyclomine (BENTYL) 20 MG tablet Take 1 tablet (20 mg total) by mouth 2 (two) times daily. 08/16/15   Mady Gemma, PA-C  ibuprofen (ADVIL,MOTRIN) 800 MG tablet Take 1 tablet (800 mg total) by mouth 3 (three) times daily. 08/16/15   Mady Gemma, PA-C  levonorgestrel (MIRENA) 20 MCG/24HR IUD 1 each by Intrauterine route once.    Historical Provider, MD   BP 113/64 mmHg  Pulse 73  Temp(Src) 98.2 F (36.8 C) (Oral)  Resp 12  Ht  (1.499 m)  Wt 159 lb 9.6 oz (72.394 kg)  BMI 32.22 kg/m2  SpO2 97%  LMP 07/21/2015 Physical Exam  Constitutional: She is oriented to person, place, and time. She appears well-developed and well-nourished. No distress.  HENT:  Head: Normocephalic and atraumatic.  Eyes: Conjunctivae and EOM are normal.  Neck: Normal range of motion.  Cardiovascular: Normal rate and regular rhythm.  Exam reveals no gallop and no friction rub.   No murmur heard. Pulmonary/Chest: Effort normal and breath sounds normal. She has no wheezes. She has no rales. She exhibits no tenderness.  Abdominal: Soft. She exhibits no distension. There is no tenderness. There is no rebound and no guarding.  Musculoskeletal: Normal range of motion.  Neurological: She is alert and oriented to person, place, and time. Coordination normal.  Speech is goal-oriented. Moves limbs without ataxia.  Skin: Skin is warm and dry.  Psychiatric: She has a normal mood and affect. Her behavior is normal.  Nursing note and vitals reviewed.   ED Course  Procedures (including critical care time) Labs Review Labs Reviewed  URINALYSIS, ROUTINE W REFLEX MICROSCOPIC (NOT AT  [facility]) - Abnormal; Notable for the following:    Color, Urine AMBER (*)    APPearance CLOUDY (*)    Hgb urine dipstick SMALL (*)    Bilirubin Urine SMALL (*)    Ketones, ur 15 (*)    Protein, ur 30 (*)    Leukocytes, UA MODERATE (*)    All other components within normal limits  URINE MICROSCOPIC-ADD ON - Abnormal; Notable for the following:    Bacteria, UA FEW (*)    Crystals CA OXALATE CRYSTALS (*)    All other components within normal limits  POC URINE PREG, ED    Imaging Review No results found. I have personally reviewed and evaluated these images and lab results as part of my medical decision-making.   EKG Interpretation None      MDM   Final diagnoses:  UTI (lower urinary tract infection)    9:40 PM Urinalysis pending. Vitals stable and patient afebrile.  10:39 PM Patient's urinalysis shows UTI. Patient will be treated with keflex and be discharged. Vitals stable and patient afebrile.    Alice Beck, PA-C 08/29/15 2257  Tilden Fossa, MD 08/30/15 [id number]
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
## 2015-08-29 NOTE — ED Notes (Signed)
Patient presents with c/o dysuria. Seen here August for lower abd pain and told to come back if pain continued.

## 2015-08-29 NOTE — Discharge Instructions (Signed)
Take keflex as directed until gone. Refer to attached documents for more information.  °

## 2017-11-04 ENCOUNTER — Encounter (HOSPITAL_COMMUNITY): Payer: Self-pay | Admitting: Emergency Medicine

## 2017-11-04 ENCOUNTER — Ambulatory Visit (HOSPITAL_COMMUNITY)
Admission: EM | Admit: 2017-11-04 | Discharge: 2017-11-04 | Disposition: A | Discharge status: Home / Self Care | Payer: Self-pay | Attending: Family Medicine | Admitting: Family Medicine

## 2017-11-04 ENCOUNTER — Other Ambulatory Visit: Payer: Self-pay

## 2017-11-04 DIAGNOSIS — J029 Acute pharyngitis, unspecified: Secondary | ICD-10-CM

## 2017-11-04 DIAGNOSIS — J00 Acute nasopharyngitis [common cold]: Secondary | ICD-10-CM

## 2017-11-04 DIAGNOSIS — Z720 Tobacco use: Secondary | ICD-10-CM | POA: Insufficient documentation

## 2017-11-04 LAB — POCT RAPID STREP A: STREPTOCOCCUS, GROUP A SCREEN (DIRECT): NEGATIVE

## 2017-11-04 MED ORDER — FLUTICASONE PROPIONATE 50 MCG/ACT NA SUSP: 2.0000 | Freq: Every day | NASAL | 0 refills | Status: DC

## 2017-11-04 MED ORDER — CETIRIZINE-PSEUDOEPHEDRINE ER 5-120 MG PO TB12: 1.0000 | ORAL_TABLET | Freq: Every day | ORAL | 0 refills | Status: DC

## 2017-11-04 NOTE — ED Triage Notes (Signed)
Pt c/o sore throat x2 days 

## 2017-11-04 NOTE — ED Provider Notes (Signed)
[facility]    CSN: [id number] Arrival date & time: 11/04/17  1031     History   Chief Complaint Chief Complaint  Patient presents with  . Sore Throat    HPI Alice Humphrey is a 21 y.o. female.   21 year old female comes in for 2 day history of sore throat, rhinorrhea, nasal congestion. Denies fever, chills, night sweats. Eye pressure, without changes in vision. Denies ear pain, cough. otc nyquil, salt water gurgle. Positive sick contact. [facility] 6 years, last smoked last night. Former some day tobacco smoker, has not smoked for many months.  Denies chest pain, shortness of breath, trouble breathing.       History reviewed. No pertinent past medical history.  There are no active problems to display for this patient.   History reviewed. No pertinent surgical history.  OB History    No data available       Home Medications    Prior to Admission medications   Medication Sig Start Date End Date Taking? Authorizing Provider  cetirizine-pseudoephedrine (ZYRTEC-D) 5-120 MG tablet Take 1 tablet daily by mouth. 11/04/17   Edan Serratore V, PA-C  fluticasone (FLONASE) 50 MCG/ACT nasal spray Place 2 sprays daily into both nostrils. 11/04/17   Belinda FisherYu, Presten Joost V, PA-C    Family History No family history on file.  Social History Social History   Tobacco Use  . Smoking status: Current Some Day Smoker  . Smokeless tobacco: Never Used  Substance Use Topics  . Alcohol use: Yes  . Drug use: Yes    Types: Marijuana     Allergies   Patient has no known allergies.   Review of Systems Review of Systems  Reason unable to perform ROS: See HPI as above.     Physical Exam Triage Vital Signs ED Triage Vitals [11/04/17 1107]  Enc Vitals Group     BP 112/71     Pulse Rate 74     Resp 14     Temp 98.5 F (36.9 C)     Temp src      SpO2 100 %     Weight      Height      Head Circumference      Peak Flow      Pain Score 8     Pain Loc    Pain Edu?      Excl. in GC?    No data found.  Updated Vital Signs BP 112/71   Pulse 74   Temp 98.5 F (36.9 C)   Resp 14   LMP 11/04/2017   SpO2 100%   Physical Exam  Constitutional: She is oriented to person, place, and time. She appears well-developed and well-nourished. No distress.  HENT:  Head: Normocephalic and atraumatic.  Right Ear: Tympanic membrane, external ear and ear canal normal. Tympanic membrane is not erythematous and not bulging.  Left Ear: External ear and ear canal normal. Tympanic membrane is erythematous. Tympanic membrane is not bulging.  Nose: Rhinorrhea present. Right sinus exhibits no maxillary sinus tenderness and no frontal sinus tenderness. Left sinus exhibits no maxillary sinus tenderness and no frontal sinus tenderness.  Mouth/Throat: Uvula is midline, oropharynx is clear and moist and mucous membranes are normal.  Eyes: Conjunctivae are normal. Pupils are equal, round, and reactive to light.  Neck: Normal range of motion. Neck supple.  Cardiovascular: Normal rate, regular rhythm and normal heart sounds. Exam reveals no gallop and no  friction rub.  No murmur heard. Pulmonary/Chest: Effort normal and breath sounds normal. She has no decreased breath sounds. She has no wheezes. She has no rhonchi. She has no rales.  Lymphadenopathy:    She has no cervical adenopathy.  Neurological: She is alert and oriented to person, place, and time.  Skin: Skin is warm and dry.  Psychiatric: She has a normal mood and affect. Her behavior is normal. Judgment normal.     UC Treatments / Results  Labs (all labs ordered are listed, but only abnormal results are displayed) Labs Reviewed  CULTURE, GROUP A STREP [facility]  POCT RAPID STREP A    EKG  EKG Interpretation None       Radiology No results found.  Procedures Procedures (including critical care time)  Medications Ordered in UC Medications - No data to display   Initial Impression / Assessment  and Plan / UC Course  I have reviewed the triage vital signs and the nursing notes.  Pertinent labs & imaging results that were available during my care of the patient were reviewed by me and considered in my medical decision making (see chart for details).    Rapid strep negative. Symptomatic treatment as needed. Return precautions given.   Final Clinical Impressions(s) / UC Diagnoses   Final diagnoses:  Acute nasopharyngitis    ED Discharge Orders        Ordered    fluticasone (FLONASE) 50 MCG/ACT nasal spray  Daily     11/04/17 1139    cetirizine-pseudoephedrine (ZYRTEC-D) 5-120 MG tablet  Daily     11/04/17 1139        Belinda FisherYu, Channin Agustin V, [place]-C 11/04/17 1142

## 2017-11-04 NOTE — Discharge Instructions (Signed)
Rapid strep negative. Symptoms are most likely due to viral illness. Flonase and/or Zyrtec-D for nasal congestion. You can use over the counter nasal saline rinse such as neti pot for nasal congestion. Keep hydrated, your urine should be clear to pale yellow in color. Tylenol/motrin for pain and fever. Monitor for any worsening of symptoms, swelling of the throat, trouble breathing, trouble swallowing, follow up for reevaluation.   For sore throat try using a honey-based tea. Use 3 teaspoons of honey with juice squeezed from half lemon. Place shaved pieces of ginger into 1/2-1 cup of water and warm over stove top. Then mix the ingredients and repeat every 4 hours as needed.

## 2017-11-06 LAB — CULTURE, GROUP A STREP (THRC)

## 2017-12-18 ENCOUNTER — Encounter (HOSPITAL_COMMUNITY): Payer: Self-pay | Admitting: Family Medicine

## 2017-12-18 ENCOUNTER — Ambulatory Visit (HOSPITAL_COMMUNITY)
Admission: EM | Admit: 2017-12-18 | Discharge: 2017-12-18 | Disposition: A | Discharge status: Home / Self Care | Payer: Self-pay | Attending: Internal Medicine | Admitting: Internal Medicine

## 2017-12-18 DIAGNOSIS — H6592 Unspecified nonsuppurative otitis media, left ear: Secondary | ICD-10-CM

## 2017-12-18 DIAGNOSIS — M26622 Arthralgia of left temporomandibular joint: Secondary | ICD-10-CM

## 2017-12-18 MED ORDER — IBUPROFEN 600 MG PO TABS: 600.0000 mg | ORAL_TABLET | Freq: Three times a day (TID) | ORAL | 0 refills | Status: DC

## 2017-12-18 NOTE — ED Provider Notes (Signed)
[facility]    CSN: [id number] Arrival date & time: 12/18/17  1025     History   Chief Complaint Chief Complaint  Patient presents with  . Jaw Pain  . Otalgia    HPI Alice Humphrey is a 21 y.o. female.   21 year old female, presenting today complaining of left jaw pain.  Patient states that she has had pain to her left jaw over the past 2-3 days that is worsened with eating, yawning and sneezing.  States that she has had mild clicking sensation to the left side of the jaw as well.  Also complaining of some fullness in her left ear.  Denies any associated ear pain or drainage.   The history is provided by the patient.  Otalgia  Location:  Left Behind ear:  No abnormality Quality:  Aching Severity:  Mild Onset quality:  Gradual Duration:  2 days Timing:  Constant Progression:  Unchanged Chronicity:  New Context: not direct blow, not elevation change, not foreign body in ear, not loud noise, not recent URI and not water in ear   Relieved by:  Nothing Worsened by:  Nothing Ineffective treatments:  None tried Associated symptoms: no abdominal pain, no congestion, no cough, no diarrhea, no ear discharge, no fever, no headaches, no hearing loss, no neck pain, no rash, no rhinorrhea, no sore throat, no tinnitus and no vomiting   Risk factors: no recent travel, no chronic ear infection and no prior ear surgery     History reviewed. No pertinent past medical history.  There are no active problems to display for this patient.   History reviewed. No pertinent surgical history.  OB History    No data available       Home Medications    Prior to Admission medications   Medication Sig Start Date End Date Taking? Authorizing Provider  cetirizine-pseudoephedrine (ZYRTEC-D) 5-120 MG tablet Take 1 tablet daily by mouth. 11/04/17   Yu, Amy V, PA-C  fluticasone (FLONASE) 50 MCG/ACT nasal spray Place 2 sprays daily into both nostrils. 11/04/17   Cathie HoopsYu, Amy V, PA-C    ibuprofen (ADVIL,MOTRIN) 600 MG tablet Take 1 tablet (600 mg total) by mouth every 8 (eight) hours. 12/18/17   Alecia LemmingBlue, Saabir Blyth C, PA-C    Family History History reviewed. No pertinent family history.  Social History Social History   Tobacco Use  . Smoking status: Current Some Day Smoker  . Smokeless tobacco: Never Used  Substance Use Topics  . Alcohol use: Yes  . Drug use: Yes    Types: Marijuana     Allergies   Patient has no known allergies.   Review of Systems Review of Systems  Constitutional: Negative for chills and fever.  HENT: Positive for ear pain. Negative for congestion, ear discharge, hearing loss, rhinorrhea, sore throat and tinnitus.   Eyes: Negative for pain and visual disturbance.  Respiratory: Negative for cough and shortness of breath.   Cardiovascular: Negative for chest pain and palpitations.  Gastrointestinal: Negative for abdominal pain, diarrhea and vomiting.  Genitourinary: Negative for dysuria and hematuria.  Musculoskeletal: Negative for arthralgias, back pain and neck pain.  Skin: Negative for color change and rash.  Neurological: Negative for seizures, syncope and headaches.  All other systems reviewed and are negative.    Physical Exam Triage Vital Signs ED Triage Vitals  Enc Vitals Group     BP 12/18/17 1117 (!) 116/7     Pulse Rate 12/18/17 1117 (!) 11     Resp  12/18/17 1117 18     Temp 12/18/17 1117 98.6 F (37 C)     Temp src --      SpO2 12/18/17 1117 100 %     Weight --      Height --      Head Circumference --      Peak Flow --      Pain Score 12/18/17 1116 7     Pain Loc --      Pain Edu? --      Excl. in GC? --    No data found.  Updated Vital Signs BP (!) 116/7   Pulse (!) 11   Temp 98.6 F (37 C)   Resp 18   LMP 11/16/2017   SpO2 100%   Visual Acuity Right Eye Distance:   Left Eye Distance:   Bilateral Distance:    Right Eye Near:   Left Eye Near:    Bilateral Near:     Physical Exam   Constitutional: She appears well-developed and well-nourished. No distress.  HENT:  Head: Normocephalic and atraumatic.  Right Ear: Hearing, tympanic membrane, external ear and ear canal normal.  Left Ear: Hearing, external ear and ear canal normal. A middle ear effusion is present.  Nose: Nose normal.  Mouth/Throat: Oropharynx is clear and moist. No oropharyngeal exudate, posterior oropharyngeal edema, posterior oropharyngeal erythema or tonsillar abscesses.  Mild tenderness over the left jaw.  There is a mild clicking sensation noted.  Eyes: Conjunctivae are normal.  Neck: Neck supple.  Cardiovascular: Normal rate and regular rhythm.  No murmur heard. Pulmonary/Chest: Effort normal and breath sounds normal. No stridor. No respiratory distress. She has no wheezes. She has no rhonchi. She has no rales.  Abdominal: Soft. There is no tenderness.  Musculoskeletal: She exhibits no edema.  Neurological: She is alert.  Skin: Skin is warm and dry.  Psychiatric: She has a normal mood and affect.  Nursing note and vitals reviewed.    UC Treatments / Results  Labs (all labs ordered are listed, but only abnormal results are displayed) Labs Reviewed - No data to display  EKG  EKG Interpretation None       Radiology No results found.  Procedures Procedures (including critical care time)  Medications Ordered in UC Medications - No data to display   Initial Impression / Assessment and Plan / UC Course  I have reviewed the triage vital signs and the nursing notes.  Pertinent labs & imaging results that were available during my care of the patient were reviewed by me and considered in my medical decision making (see chart for details).     Left jaw pain -symptoms and exam consistent with TMJ arthralgia.  Will treat with anti-inflammatories and rest. Recommended eating soft foods Left ear pain -patient does have a middle ear effusion.  Final Clinical Impressions(s) / UC  Diagnoses   Final diagnoses:  Arthralgia of left temporomandibular joint  Fluid level behind tympanic membrane of left ear    ED Discharge Orders        Ordered    ibuprofen (ADVIL,MOTRIN) 600 MG tablet  Every 8 hours     12/18/17 1140       Controlled Substance Prescriptions [place] Controlled Substance Registry consulted? Not Applicable   Alecia LemmingBlue, Amariana Mirando C, [place]-C 12/18/17 1157

## 2017-12-18 NOTE — ED Triage Notes (Signed)
Pt here for left jaw ans ear pain x 2 days.

## 2018-07-01 ENCOUNTER — Encounter: Payer: Self-pay | Admitting: Obstetrics and Gynecology

## 2018-07-01 ENCOUNTER — Ambulatory Visit (INDEPENDENT_AMBULATORY_CARE_PROVIDER_SITE_OTHER): Payer: Medicaid Other | Admitting: Obstetrics and Gynecology

## 2018-07-01 ENCOUNTER — Other Ambulatory Visit (HOSPITAL_COMMUNITY)
Admission: RE | Admit: 2018-07-01 | Discharge: 2018-07-01 | Disposition: A | Discharge status: Home / Self Care | Payer: Medicaid Other | Source: Ambulatory Visit | Attending: Obstetrics and Gynecology | Admitting: Obstetrics and Gynecology

## 2018-07-01 DIAGNOSIS — Z3A10 10 weeks gestation of pregnancy: Secondary | ICD-10-CM | POA: Diagnosis not present

## 2018-07-01 DIAGNOSIS — Z3491 Encounter for supervision of normal pregnancy, unspecified, first trimester: Secondary | ICD-10-CM | POA: Diagnosis not present

## 2018-07-01 DIAGNOSIS — Z3490 Encounter for supervision of normal pregnancy, unspecified, unspecified trimester: Secondary | ICD-10-CM

## 2018-07-01 DIAGNOSIS — Z3401 Encounter for supervision of normal first pregnancy, first trimester: Secondary | ICD-10-CM | POA: Diagnosis not present

## 2018-07-01 MED ORDER — DOXYLAMINE-PYRIDOXINE 10-10 MG PO TBEC: 2.0000 | DELAYED_RELEASE_TABLET | Freq: Every day | ORAL | 5 refills | Status: DC

## 2018-07-01 MED ORDER — VITAFOL GUMMIES 3.33-0.333-34.8 MG PO CHEW: 2.0000 | CHEWABLE_TABLET | Freq: Every day | ORAL | 6 refills | Status: DC

## 2018-07-01 NOTE — Progress Notes (Signed)
  Subjective:    Alice Humphrey is a G1P0 5911w6d being seen today for her first obstetrical visit.  Her obstetrical history is significant for first pregnancy. Patient does intend to breast feed. Pregnancy history fully reviewed.  Patient reports nausea.  Vitals:   07/01/18 1010  BP: 126/84  Pulse: 71  Weight: 186 lb 1.6 oz (84.4 kg)    HISTORY: OB History  Gravida Para Term Preterm AB Living  1            SAB TAB Ectopic Multiple Live Births               # Outcome Date GA Lbr Len/2nd Weight Sex Delivery Anes PTL Lv  1 Current            Past Medical History:  Diagnosis Date  . Pre-diabetes 2006   History reviewed. No pertinent surgical history. Family History  Problem Relation Age of Onset  . AAA (abdominal aortic aneurysm) Mother   . Hypertension Maternal Grandmother   . Diabetes Maternal Grandmother   . Hypertension Maternal Grandfather   . Diabetes Maternal Grandfather   . COPD Maternal Grandfather      Exam    Uterus:   12-weeks  Pelvic Exam:    Perineum: No Hemorrhoids, Normal Perineum   Vulva: normal   Vagina:  normal mucosa, normal discharge   pH:    Cervix: nulliparous appearance   Adnexa: normal adnexa and no mass, fullness, tenderness   Bony Pelvis: gynecoid  System: Breast:  normal appearance, no masses or tenderness   Skin: normal coloration and turgor, no rashes    Neurologic: oriented, no focal deficits   Extremities: normal strength, tone, and muscle mass   HEENT extra ocular movement intact   Mouth/Teeth mucous membranes moist, pharynx normal without lesions and dental hygiene good   Neck supple and no masses   Cardiovascular: regular rate and rhythm   Respiratory:  appears well, vitals normal, no respiratory distress, acyanotic, normal RR, chest clear, no wheezing, crepitations, rhonchi, normal symmetric air entry   Abdomen: soft, non-tender; bowel sounds normal; no masses,  no organomegaly   Urinary:       Assessment:    Pregnancy: G1P0 Patient Active Problem List   Diagnosis Date Noted  . Encounter for supervision of normal pregnancy, unspecified, unspecified trimester 07/01/2018        Plan:     Initial labs drawn. Prenatal vitamins. Problem list reviewed and updated. Genetic Screening discussed : panorama ordered.  Ultrasound discussed; fetal survey: requested. Patient enrolled in [employer] app Rx diclegis provided  Follow up in 4 weeks. 50% of 30 min visit spent on counseling and coordination of care.     Kalinda Romaniello 07/01/2018

## 2018-07-01 NOTE — Patient Instructions (Signed)
 First Trimester of Pregnancy The first trimester of pregnancy is from week 1 until the end of week 13 (months 1 through 3). A week after a sperm fertilizes an egg, the egg will implant on the wall of the uterus. This embryo will begin to develop into a baby. Genes from you and your partner will form the baby. The female genes will determine whether the baby will be a boy or a girl. At 6-8 weeks, the eyes and face will be formed, and the heartbeat can be seen on ultrasound. At the end of 12 weeks, all the baby's organs will be formed. Now that you are pregnant, you will want to do everything you can to have a healthy baby. Two of the most important things are to get good prenatal care and to follow your health care provider's instructions. Prenatal care is all the medical care you receive before the baby's birth. This care will help prevent, find, and treat any problems during the pregnancy and childbirth. Body changes during your first trimester Your body goes through many changes during pregnancy. The changes vary from woman to woman.  You may gain or lose a couple of pounds at first.  You may feel sick to your stomach (nauseous) and you may throw up (vomit). If the vomiting is uncontrollable, call your health care provider.  You may tire easily.  You may develop headaches that can be relieved by medicines. All medicines should be approved by your health care provider.  You may urinate more often. Painful urination may mean you have a bladder infection.  You may develop heartburn as a result of your pregnancy.  You may develop constipation because certain hormones are causing the muscles that push stool through your intestines to slow down.  You may develop hemorrhoids or swollen veins (varicose veins).  Your breasts may begin to grow larger and become tender. Your nipples may stick out more, and the tissue that surrounds them (areola) may become darker.  Your gums may bleed and may be  sensitive to brushing and flossing.  Dark spots or blotches (chloasma, mask of pregnancy) may develop on your face. This will likely fade after the baby is born.  Your menstrual periods will stop.  You may have a loss of appetite.  You may develop cravings for certain kinds of food.  You may have changes in your emotions from day to day, such as being excited to be pregnant or being concerned that something may go wrong with the pregnancy and baby.  You may have more vivid and strange dreams.  You may have changes in your hair. These can include thickening of your hair, rapid growth, and changes in texture. Some women also have hair loss during or after pregnancy, or hair that feels dry or thin. Your hair will most likely return to normal after your baby is born.  What to expect at prenatal visits During a routine prenatal visit:  You will be weighed to make sure you and the baby are growing normally.  Your blood pressure will be taken.  Your abdomen will be measured to track your baby's growth.  The fetal heartbeat will be listened to between weeks 10 and 14 of your pregnancy.  Test results from any previous visits will be discussed.  Your health care provider may ask you:  How you are feeling.  If you are feeling the baby move.  If you have had any abnormal symptoms, such as leaking fluid, bleeding, severe   headaches, or abdominal cramping.  If you are using any tobacco products, including cigarettes, chewing tobacco, and electronic cigarettes.  If you have any questions.  Other tests that may be performed during your first trimester include:  Blood tests to find your blood type and to check for the presence of any previous infections. The tests will also be used to check for low iron levels (anemia) and protein on red blood cells (Rh antibodies). Depending on your risk factors, or if you previously had diabetes during pregnancy, you may have tests to check for high blood  sugar that affects pregnant women (gestational diabetes).  Urine tests to check for infections, diabetes, or protein in the urine.  An ultrasound to confirm the proper growth and development of the baby.  Fetal screens for spinal cord problems (spina bifida) and Down syndrome.  HIV (human immunodeficiency virus) testing. Routine prenatal testing includes screening for HIV, unless you choose not to have this test.  You may need other tests to make sure you and the baby are doing well.  Follow these instructions at home: Medicines  Follow your health care provider's instructions regarding medicine use. Specific medicines may be either safe or unsafe to take during pregnancy.  Take a prenatal vitamin that contains at least 600 micrograms (mcg) of folic acid.  If you develop constipation, try taking a stool softener if your health care provider approves. Eating and drinking  Eat a balanced diet that includes fresh fruits and vegetables, whole grains, good sources of protein such as meat, eggs, or tofu, and low-fat dairy. Your health care provider will help you determine the amount of weight gain that is right for you.  Avoid raw meat and uncooked cheese. These carry germs that can cause birth defects in the baby.  Eating four or five small meals rather than three large meals a day may help relieve nausea and vomiting. If you start to feel nauseous, eating a few soda crackers can be helpful. Drinking liquids between meals, instead of during meals, also seems to help ease nausea and vomiting.  Limit foods that are high in fat and processed sugars, such as fried and sweet foods.  To prevent constipation: ? Eat foods that are high in fiber, such as fresh fruits and vegetables, whole grains, and beans. ? Drink enough fluid to keep your urine clear or pale yellow. Activity  Exercise only as directed by your health care provider. Most women can continue their usual exercise routine during  pregnancy. Try to exercise for 30 minutes at least 5 days a week. Exercising will help you: ? Control your weight. ? Stay in shape. ? Be prepared for labor and delivery.  Experiencing pain or cramping in the lower abdomen or lower back is a good sign that you should stop exercising. Check with your health care provider before continuing with normal exercises.  Try to avoid standing for long periods of time. Move your legs often if you must stand in one place for a long time.  Avoid heavy lifting.  Wear low-heeled shoes and practice good posture.  You may continue to have sex unless your health care provider tells you not to. Relieving pain and discomfort  Wear a good support bra to relieve breast tenderness.  Take warm sitz baths to soothe any pain or discomfort caused by hemorrhoids. Use hemorrhoid cream if your health care provider approves.  Rest with your legs elevated if you have leg cramps or low back pain.  If you   develop varicose veins in your legs, wear support hose. Elevate your feet for 15 minutes, 3-4 times a day. Limit salt in your diet. Prenatal care  Schedule your prenatal visits by the twelfth week of pregnancy. They are usually scheduled monthly at first, then more often in the last 2 months before delivery.  Write down your questions. Take them to your prenatal visits.  Keep all your prenatal visits as told by your health care provider. This is important. Safety  Wear your seat belt at all times when driving.  Make a list of emergency phone numbers, including numbers for family, friends, the hospital, and police and fire departments. General instructions  Ask your health care provider for a referral to a local prenatal education class. Begin classes no later than the beginning of month 6 of your pregnancy.  Ask for help if you have counseling or nutritional needs during pregnancy. Your health care provider can offer advice or refer you to specialists for help  with various needs.  Do not use hot tubs, steam rooms, or saunas.  Do not douche or use tampons or scented sanitary pads.  Do not cross your legs for long periods of time.  Avoid cat litter boxes and soil used by cats. These carry germs that can cause birth defects in the baby and possibly loss of the fetus by miscarriage or stillbirth.  Avoid all smoking, herbs, alcohol, and medicines not prescribed by your health care provider. Chemicals in these products affect the formation and growth of the baby.  Do not use any products that contain nicotine or tobacco, such as cigarettes and e-cigarettes. If you need help quitting, ask your health care provider. You may receive counseling support and other resources to help you quit.  Schedule a dentist appointment. At home, brush your teeth with a soft toothbrush and be gentle when you floss. Contact a health care provider if:  You have dizziness.  You have mild pelvic cramps, pelvic pressure, or nagging pain in the abdominal area.  You have persistent nausea, vomiting, or diarrhea.  You have a bad smelling vaginal discharge.  You have pain when you urinate.  You notice increased swelling in your face, hands, legs, or ankles.  You are exposed to fifth disease or chickenpox.  You are exposed to German measles (rubella) and have never had it. Get help right away if:  You have a fever.  You are leaking fluid from your vagina.  You have spotting or bleeding from your vagina.  You have severe abdominal cramping or pain.  You have rapid weight gain or loss.  You vomit blood or material that looks like coffee grounds.  You develop a severe headache.  You have shortness of breath.  You have any kind of trauma, such as from a fall or a car accident. Summary  The first trimester of pregnancy is from week 1 until the end of week 13 (months 1 through 3).  Your body goes through many changes during pregnancy. The changes vary from  woman to woman.  You will have routine prenatal visits. During those visits, your health care provider will examine you, discuss any test results you may have, and talk with you about how you are feeling. This information is not intended to replace advice given to you by your health care provider. Make sure you discuss any questions you have with your health care provider. Document Released: 12/02/2001 Document Revised: 11/19/2016 Document Reviewed: 11/19/2016 [employer] Interactive Patient Education  2018   [employer]   Second Trimester of Pregnancy The second trimester is from week 14 through week 27 (months 4 through 6). The second trimester is often a time when you feel your best. Your body has adjusted to being pregnant, and you begin to feel better physically. Usually, morning sickness has lessened or quit completely, you may have more energy, and you may have an increase in appetite. The second trimester is also a time when the fetus is growing rapidly. At the end of the sixth month, the fetus is about 9 inches long and weighs about 1 pounds. You will likely begin to feel the baby move (quickening) between 16 and 20 weeks of pregnancy. Body changes during your second trimester Your body continues to go through many changes during your second trimester. The changes vary from woman to woman.  Your weight will continue to increase. You will notice your lower abdomen bulging out.  You may begin to get stretch marks on your hips, abdomen, and breasts.  You may develop headaches that can be relieved by medicines. The medicines should be approved by your health care provider.  You may urinate more often because the fetus is pressing on your bladder.  You may develop or continue to have heartburn as a result of your pregnancy.  You may develop constipation because certain hormones are causing the muscles that push waste through your intestines to slow down.  You may develop hemorrhoids or  swollen, bulging veins (varicose veins).  You may have back pain. This is caused by: ? Weight gain. ? Pregnancy hormones that are relaxing the joints in your pelvis. ? A shift in weight and the muscles that support your balance.  Your breasts will continue to grow and they will continue to become tender.  Your gums may bleed and may be sensitive to brushing and flossing.  Dark spots or blotches (chloasma, mask of pregnancy) may develop on your face. This will likely fade after the baby is born.  A dark line from your belly button to the pubic area (linea nigra) may appear. This will likely fade after the baby is born.  You may have changes in your hair. These can include thickening of your hair, rapid growth, and changes in texture. Some women also have hair loss during or after pregnancy, or hair that feels dry or thin. Your hair will most likely return to normal after your baby is born.  What to expect at prenatal visits During a routine prenatal visit:  You will be weighed to make sure you and the fetus are growing normally.  Your blood pressure will be taken.  Your abdomen will be measured to track your baby's growth.  The fetal heartbeat will be listened to.  Any test results from the previous visit will be discussed.  Your health care provider may ask you:  How you are feeling.  If you are feeling the baby move.  If you have had any abnormal symptoms, such as leaking fluid, bleeding, severe headaches, or abdominal cramping.  If you are using any tobacco products, including cigarettes, chewing tobacco, and electronic cigarettes.  If you have any questions.  Other tests that may be performed during your second trimester include:  Blood tests that check for: ? Low iron levels (anemia). ? High blood sugar that affects pregnant women (gestational diabetes) between 24 and 28 weeks. ? Rh antibodies. This is to check for a protein on red blood cells (Rh factor).  Urine  tests to   check for infections, diabetes, or protein in the urine.  An ultrasound to confirm the proper growth and development of the baby.  An amniocentesis to check for possible genetic problems.  Fetal screens for spina bifida and Down syndrome.  HIV (human immunodeficiency virus) testing. Routine prenatal testing includes screening for HIV, unless you choose not to have this test.  Follow these instructions at home: Medicines  Follow your health care provider's instructions regarding medicine use. Specific medicines may be either safe or unsafe to take during pregnancy.  Take a prenatal vitamin that contains at least 600 micrograms (mcg) of folic acid.  If you develop constipation, try taking a stool softener if your health care provider approves. Eating and drinking  Eat a balanced diet that includes fresh fruits and vegetables, whole grains, good sources of protein such as meat, eggs, or tofu, and low-fat dairy. Your health care provider will help you determine the amount of weight gain that is right for you.  Avoid raw meat and uncooked cheese. These carry germs that can cause birth defects in the baby.  If you have low calcium intake from food, talk to your health care provider about whether you should take a daily calcium supplement.  Limit foods that are high in fat and processed sugars, such as fried and sweet foods.  To prevent constipation: ? Drink enough fluid to keep your urine clear or pale yellow. ? Eat foods that are high in fiber, such as fresh fruits and vegetables, whole grains, and beans. Activity  Exercise only as directed by your health care provider. Most women can continue their usual exercise routine during pregnancy. Try to exercise for 30 minutes at least 5 days a week. Stop exercising if you experience uterine contractions.  Avoid heavy lifting, wear low heel shoes, and practice good posture.  A sexual relationship may be continued unless your health  care provider directs you otherwise. Relieving pain and discomfort  Wear a good support bra to prevent discomfort from breast tenderness.  Take warm sitz baths to soothe any pain or discomfort caused by hemorrhoids. Use hemorrhoid cream if your health care provider approves.  Rest with your legs elevated if you have leg cramps or low back pain.  If you develop varicose veins, wear support hose. Elevate your feet for 15 minutes, 3-4 times a day. Limit salt in your diet. Prenatal Care  Write down your questions. Take them to your prenatal visits.  Keep all your prenatal visits as told by your health care provider. This is important. Safety  Wear your seat belt at all times when driving.  Make a list of emergency phone numbers, including numbers for family, friends, the hospital, and police and fire departments. General instructions  Ask your health care provider for a referral to a local prenatal education class. Begin classes no later than the beginning of month 6 of your pregnancy.  Ask for help if you have counseling or nutritional needs during pregnancy. Your health care provider can offer advice or refer you to specialists for help with various needs.  Do not use hot tubs, steam rooms, or saunas.  Do not douche or use tampons or scented sanitary pads.  Do not cross your legs for long periods of time.  Avoid cat litter boxes and soil used by cats. These carry germs that can cause birth defects in the baby and possibly loss of the fetus by miscarriage or stillbirth.  Avoid all smoking, herbs, alcohol, and unprescribed drugs. Chemicals   in these products can affect the formation and growth of the baby.  Do not use any products that contain nicotine or tobacco, such as cigarettes and e-cigarettes. If you need help quitting, ask your health care provider.  Visit your dentist if you have not gone yet during your pregnancy. Use a soft toothbrush to brush your teeth and be gentle when  you floss. Contact a health care provider if:  You have dizziness.  You have mild pelvic cramps, pelvic pressure, or nagging pain in the abdominal area.  You have persistent nausea, vomiting, or diarrhea.  You have a bad smelling vaginal discharge.  You have pain when you urinate. Get help right away if:  You have a fever.  You are leaking fluid from your vagina.  You have spotting or bleeding from your vagina.  You have severe abdominal cramping or pain.  You have rapid weight gain or weight loss.  You have shortness of breath with chest pain.  You notice sudden or extreme swelling of your face, hands, ankles, feet, or legs.  You have not felt your baby move in over an hour.  You have severe headaches that do not go away when you take medicine.  You have vision changes. Summary  The second trimester is from week 14 through week 27 (months 4 through 6). It is also a time when the fetus is growing rapidly.  Your body goes through many changes during pregnancy. The changes vary from woman to woman.  Avoid all smoking, herbs, alcohol, and unprescribed drugs. These chemicals affect the formation and growth your baby.  Do not use any tobacco products, such as cigarettes, chewing tobacco, and e-cigarettes. If you need help quitting, ask your health care provider.  Contact your health care provider if you have any questions. Keep all prenatal visits as told by your health care provider. This is important. This information is not intended to replace advice given to you by your health care provider. Make sure you discuss any questions you have with your health care provider. Document Released: 12/02/2001 Document Revised: 01/13/2017 Document Reviewed: 01/13/2017 [employer] Interactive Patient Education  2018 [employer]   Contraception Choices Contraception, also called birth control, refers to methods or devices that prevent pregnancy. Hormonal methods Contraceptive  implant A contraceptive implant is a thin, plastic tube that contains a hormone. It is inserted into the upper part of the arm. It can remain in place for up to 3 years. Progestin-only injections Progestin-only injections are injections of progestin, a synthetic form of the hormone progesterone. They are given every 3 months by a health care provider. Birth control pills Birth control pills are pills that contain hormones that prevent pregnancy. They must be taken once a day, preferably at the same time each day. Birth control patch The birth control patch contains hormones that prevent pregnancy. It is placed on the skin and must be changed once a week for three weeks and removed on the fourth week. A prescription is needed to use this method of contraception. Vaginal ring A vaginal ring contains hormones that prevent pregnancy. It is placed in the vagina for three weeks and removed on the fourth week. After that, the process is repeated with a new ring. A prescription is needed to use this method of contraception. Emergency contraceptive Emergency contraceptives prevent pregnancy after unprotected sex. They come in pill form and can be taken up to 5 days after sex. They work best the sooner they are taken after having   sex. Most emergency contraceptives are available without a prescription. This method should not be used as your only form of birth control. Barrier methods Female condom A female condom is a thin sheath that is worn over the penis during sex. Condoms keep sperm from going inside a woman's body. They can be used with a spermicide to increase their effectiveness. They should be disposed after a single use. Female condom A female condom is a soft, loose-fitting sheath that is put into the vagina before sex. The condom keeps sperm from going inside a woman's body. They should be disposed after a single use. Diaphragm A diaphragm is a soft, dome-shaped barrier. It is inserted into the vagina  before sex, along with a spermicide. The diaphragm blocks sperm from entering the uterus, and the spermicide kills sperm. A diaphragm should be left in the vagina for 6-8 hours after sex and removed within 24 hours. A diaphragm is prescribed and fitted by a health care provider. A diaphragm should be replaced every 1-2 years, after giving birth, after gaining more than 15 lb (6.8 kg), and after pelvic surgery. Cervical cap A cervical cap is a round, soft latex or plastic cup that fits over the cervix. It is inserted into the vagina before sex, along with spermicide. It blocks sperm from entering the uterus. The cap should be left in place for 6-8 hours after sex and removed within 48 hours. A cervical cap must be prescribed and fitted by a health care provider. It should be replaced every 2 years. Sponge A sponge is a soft, circular piece of polyurethane foam with spermicide on it. The sponge helps block sperm from entering the uterus, and the spermicide kills sperm. To use it, you make it wet and then insert it into the vagina. It should be inserted before sex, left in for at least 6 hours after sex, and removed and thrown away within 30 hours. Spermicides Spermicides are chemicals that kill or block sperm from entering the cervix and uterus. They can come as a cream, jelly, suppository, foam, or tablet. A spermicide should be inserted into the vagina with an applicator at least 10-15 minutes before sex to allow time for it to work. The process must be repeated every time you have sex. Spermicides do not require a prescription. Intrauterine contraception Intrauterine device (IUD) An IUD is a T-shaped device that is put in a woman's uterus. There are two types:  Hormone IUD.This type contains progestin, a synthetic form of the hormone progesterone. This type can stay in place for 3-5 years.  Copper IUD.This type is wrapped in copper wire. It can stay in place for 10 years.  Permanent methods of  contraception Female tubal ligation In this method, a woman's fallopian tubes are sealed, tied, or blocked during surgery to prevent eggs from traveling to the uterus. Hysteroscopic sterilization In this method, a small, flexible insert is placed into each fallopian tube. The inserts cause scar tissue to form in the fallopian tubes and block them, so sperm cannot reach an egg. The procedure takes about 3 months to be effective. Another form of birth control must be used during those 3 months. Female sterilization This is a procedure to tie off the tubes that carry sperm (vasectomy). After the procedure, the man can still ejaculate fluid (semen). Natural planning methods Natural family planning In this method, a couple does not have sex on days when the woman could become pregnant. Calendar method This means keeping track of the   length of each menstrual cycle, identifying the days when pregnancy can happen, and not having sex on those days. Ovulation method In this method, a couple avoids sex during ovulation. Symptothermal method This method involves not having sex during ovulation. The woman typically checks for ovulation by watching changes in her temperature and in the consistency of cervical mucus. Post-ovulation method In this method, a couple waits to have sex until after ovulation. Summary  Contraception, also called birth control, means methods or devices that prevent pregnancy.  Hormonal methods of contraception include implants, injections, pills, patches, vaginal rings, and emergency contraceptives.  Barrier methods of contraception can include female condoms, female condoms, diaphragms, cervical caps, sponges, and spermicides.  There are two types of IUDs (intrauterine devices). An IUD can be put in a woman's uterus to prevent pregnancy for 3-5 years.  Permanent sterilization can be done through a procedure for males, females, or both.  Natural family planning methods involve  not having sex on days when the woman could become pregnant. This information is not intended to replace advice given to you by your health care provider. Make sure you discuss any questions you have with your health care provider. Document Released: 12/08/2005 Document Revised: 01/10/2017 Document Reviewed: 01/10/2017 Elsevier Interactive Patient Education  2018 [employer] Inc.   Breastfeeding Choosing to breastfeed is one of the best decisions you can make for yourself and your baby. A change in hormones during pregnancy causes your breasts to make breast milk in your milk-producing glands. Hormones prevent breast milk from being released before your baby is born. They also prompt milk flow after birth. Once breastfeeding has begun, thoughts of your baby, as well as his or her sucking or crying, can stimulate the release of milk from your milk-producing glands. Benefits of breastfeeding Research shows that breastfeeding offers many health benefits for infants and mothers. It also offers a cost-free and convenient way to feed your baby. For your baby  Your first milk (colostrum) helps your baby's digestive system to function better.  Special cells in your milk (antibodies) help your baby to fight off infections.  Breastfed babies are less likely to develop asthma, allergies, obesity, or type 2 diabetes. They are also at lower risk for sudden infant death syndrome (SIDS).  Nutrients in breast milk are better able to meet your baby's needs compared to infant formula.  Breast milk improves your baby's brain development. For you  Breastfeeding helps to create a very special bond between you and your baby.  Breastfeeding is convenient. Breast milk costs nothing and is always available at the correct temperature.  Breastfeeding helps to burn calories. It helps you to lose the weight that you gained during pregnancy.  Breastfeeding makes your uterus return faster to its size before pregnancy.  It also slows bleeding (lochia) after you give birth.  Breastfeeding helps to lower your risk of developing type 2 diabetes, osteoporosis, rheumatoid arthritis, cardiovascular disease, and breast, ovarian, uterine, and endometrial cancer later in life. Breastfeeding basics Starting breastfeeding  Find a comfortable place to sit or lie down, with your neck and back well-supported.  Place a pillow or a rolled-up blanket under your baby to bring him or her to the level of your breast (if you are seated). Nursing pillows are specially designed to help support your arms and your baby while you breastfeed.  Make sure that your baby's tummy (abdomen) is facing your abdomen.  Gently massage your breast. With your fingertips, massage from the outer edges of   your breast inward toward the nipple. This encourages milk flow. If your milk flows slowly, you may need to continue this action during the feeding.  Support your breast with 4 fingers underneath and your thumb above your nipple (make the letter "C" with your hand). Make sure your fingers are well away from your nipple and your baby's mouth.  Stroke your baby's lips gently with your finger or nipple.  When your baby's mouth is open wide enough, quickly bring your baby to your breast, placing your entire nipple and as much of the areola as possible into your baby's mouth. The areola is the colored area around your nipple. ? More areola should be visible above your baby's upper lip than below the lower lip. ? Your baby's lips should be opened and extended outward (flanged) to ensure an adequate, comfortable latch. ? Your baby's tongue should be between his or her lower gum and your breast.  Make sure that your baby's mouth is correctly positioned around your nipple (latched). Your baby's lips should create a seal on your breast and be turned out (everted).  It is common for your baby to suck about 2-3 minutes in order to start the flow of breast  milk. Latching Teaching your baby how to latch onto your breast properly is very important. An improper latch can cause nipple pain, decreased milk supply, and poor weight gain in your baby. Also, if your baby is not latched onto your nipple properly, he or she may swallow some air during feeding. This can make your baby fussy. Burping your baby when you switch breasts during the feeding can help to get rid of the air. However, teaching your baby to latch on properly is still the best way to prevent fussiness from swallowing air while breastfeeding. Signs that your baby has successfully latched onto your nipple  Silent tugging or silent sucking, without causing you pain. Infant's lips should be extended outward (flanged).  Swallowing heard between every 3-4 sucks once your milk has started to flow (after your let-down milk reflex occurs).  Muscle movement above and in front of his or her ears while sucking.  Signs that your baby has not successfully latched onto your nipple  Sucking sounds or smacking sounds from your baby while breastfeeding.  Nipple pain.  If you think your baby has not latched on correctly, slip your finger into the corner of your baby's mouth to break the suction and place it between your baby's gums. Attempt to start breastfeeding again. Signs of successful breastfeeding Signs from your baby  Your baby will gradually decrease the number of sucks or will completely stop sucking.  Your baby will fall asleep.  Your baby's body will relax.  Your baby will retain a small amount of milk in his or her mouth.  Your baby will let go of your breast by himself or herself.  Signs from you  Breasts that have increased in firmness, weight, and size 1-3 hours after feeding.  Breasts that are softer immediately after breastfeeding.  Increased milk volume, as well as a change in milk consistency and color by the fifth day of breastfeeding.  Nipples that are not sore,  cracked, or bleeding.  Signs that your baby is getting enough milk  Wetting at least 1-2 diapers during the first 24 hours after birth.  Wetting at least 5-6 diapers every 24 hours for the first week after birth. The urine should be clear or pale yellow by the age of 5   days.  Wetting 6-8 diapers every 24 hours as your baby continues to grow and develop.  At least 3 stools in a 24-hour period by the age of 5 days. The stool should be soft and yellow.  At least 3 stools in a 24-hour period by the age of 7 days. The stool should be seedy and yellow.  No loss of weight greater than 10% of birth weight during the first 3 days of life.  Average weight gain of 4-7 oz (113-198 g) per week after the age of 4 days.  Consistent daily weight gain by the age of 5 days, without weight loss after the age of 2 weeks. After a feeding, your baby may spit up a small amount of milk. This is normal. Breastfeeding frequency and duration Frequent feeding will help you make more milk and can prevent sore nipples and extremely full breasts (breast engorgement). Breastfeed when you feel the need to reduce the fullness of your breasts or when your baby shows signs of hunger. This is called "breastfeeding on demand." Signs that your baby is hungry include:  Increased alertness, activity, or restlessness.  Movement of the head from side to side.  Opening of the mouth when the corner of the mouth or cheek is stroked (rooting).  Increased sucking sounds, smacking lips, cooing, sighing, or squeaking.  Hand-to-mouth movements and sucking on fingers or hands.  Fussing or crying.  Avoid introducing a pacifier to your baby in the first 4-6 weeks after your baby is born. After this time, you may choose to use a pacifier. Research has shown that pacifier use during the first year of a baby's life decreases the risk of sudden infant death syndrome (SIDS). Allow your baby to feed on each breast as long as he or she  wants. When your baby unlatches or falls asleep while feeding from the first breast, offer the second breast. Because newborns are often sleepy in the first few weeks of life, you may need to awaken your baby to get him or her to feed. Breastfeeding times will vary from baby to baby. However, the following rules can serve as a guide to help you make sure that your baby is properly fed:  Newborns (babies 4 weeks of age or younger) may breastfeed every 1-3 hours.  Newborns should not go without breastfeeding for longer than 3 hours during the day or 5 hours during the night.  You should breastfeed your baby a minimum of 8 times in a 24-hour period.  Breast milk pumping Pumping and storing breast milk allows you to make sure that your baby is exclusively fed your breast milk, even at times when you are unable to breastfeed. This is especially important if you go back to work while you are still breastfeeding, or if you are not able to be present during feedings. Your lactation consultant can help you find a method of pumping that works best for you and give you guidelines about how long it is safe to store breast milk. Caring for your breasts while you breastfeed Nipples can become dry, cracked, and sore while breastfeeding. The following recommendations can help keep your breasts moisturized and healthy:  Avoid using soap on your nipples.  Wear a supportive bra designed especially for nursing. Avoid wearing underwire-style bras or extremely tight bras (sports bras).  Air-dry your nipples for 3-4 minutes after each feeding.  Use only cotton bra pads to absorb leaked breast milk. Leaking of breast milk between feedings is normal.    Use lanolin on your nipples after breastfeeding. Lanolin helps to maintain your skin's normal moisture barrier. Pure lanolin is not harmful (not toxic) to your baby. You may also hand express a few drops of breast milk and gently massage that milk into your nipples and  allow the milk to air-dry.  In the first few weeks after giving birth, some women experience breast engorgement. Engorgement can make your breasts feel heavy, warm, and tender to the touch. Engorgement peaks within 3-5 days after you give birth. The following recommendations can help to ease engorgement:  Completely empty your breasts while breastfeeding or pumping. You may want to start by applying warm, moist heat (in the shower or with warm, water-soaked hand towels) just before feeding or pumping. This increases circulation and helps the milk flow. If your baby does not completely empty your breasts while breastfeeding, pump any extra milk after he or she is finished.  Apply ice packs to your breasts immediately after breastfeeding or pumping, unless this is too uncomfortable for you. To do this: ? Put ice in a plastic bag. ? Place a towel between your skin and the bag. ? Leave the ice on for 20 minutes, 2-3 times a day.  Make sure that your baby is latched on and positioned properly while breastfeeding.  If engorgement persists after 48 hours of following these recommendations, contact your health care provider or a lactation consultant. Overall health care recommendations while breastfeeding  Eat 3 healthy meals and 3 snacks every day. Well-nourished mothers who are breastfeeding need an additional 450-500 calories a day. You can meet this requirement by increasing the amount of a balanced diet that you eat.  Drink enough water to keep your urine pale yellow or clear.  Rest often, relax, and continue to take your prenatal vitamins to prevent fatigue, stress, and low vitamin and mineral levels in your body (nutrient deficiencies).  Do not use any products that contain nicotine or tobacco, such as cigarettes and e-cigarettes. Your baby may be harmed by chemicals from cigarettes that pass into breast milk and exposure to secondhand smoke. If you need help quitting, ask your health care  provider.  Avoid alcohol.  Do not use illegal drugs or marijuana.  Talk with your health care provider before taking any medicines. These include over-the-counter and prescription medicines as well as vitamins and herbal supplements. Some medicines that may be harmful to your baby can pass through breast milk.  It is possible to become pregnant while breastfeeding. If birth control is desired, ask your health care provider about options that will be safe while breastfeeding your baby. Where to find more information: [employer]: www.llli.org Contact a health care provider if:  You feel like you want to stop breastfeeding or have become frustrated with breastfeeding.  Your nipples are cracked or bleeding.  Your breasts are red, tender, or warm.  You have: ? Painful breasts or nipples. ? A swollen area on either breast. ? A fever or chills. ? Nausea or vomiting. ? Drainage other than breast milk from your nipples.  Your breasts do not become full before feedings by the fifth day after you give birth.  You feel sad and depressed.  Your baby is: ? Too sleepy to eat well. ? Having trouble sleeping. ? More than 1 week old and wetting fewer than 6 diapers in a 24-hour period. ? Not gaining weight by 5 days of age.  Your baby has fewer than 3 stools in a   24-hour period.  Your baby's skin or the white parts of his or her eyes become yellow. Get help right away if:  Your baby is overly tired (lethargic) and does not want to wake up and feed.  Your baby develops an unexplained fever. Summary  Breastfeeding offers many health benefits for infant and mothers.  Try to breastfeed your infant when he or she shows early signs of hunger.  Gently tickle or stroke your baby's lips with your finger or nipple to allow the baby to open his or her mouth. Bring the baby to your breast. Make sure that much of the areola is in your baby's mouth. Offer one side and burp the  baby before you offer the other side.  Talk with your health care provider or lactation consultant if you have questions or you face problems as you breastfeed. This information is not intended to replace advice given to you by your health care provider. Make sure you discuss any questions you have with your health care provider. Document Released: 12/08/2005 Document Revised: 01/09/2017 Document Reviewed: 01/09/2017 Elsevier Interactive Patient Education  2018 Elsevier Inc.  

## 2018-07-02 LAB — CERVICOVAGINAL ANCILLARY ONLY
CHLAMYDIA, DNA PROBE: NEGATIVE
Neisseria Gonorrhea: NEGATIVE

## 2018-07-02 LAB — CYTOLOGY - PAP: DIAGNOSIS: NEGATIVE

## 2018-07-05 LAB — OBSTETRIC PANEL, INCLUDING HIV
Antibody Screen: NEGATIVE
Basophils Absolute: 0 10*3/uL (ref 0.0–0.2)
Basos: 0 %
EOS (ABSOLUTE): 0.1 10*3/uL (ref 0.0–0.4)
Eos: 0 %
HIV Screen 4th Generation wRfx: NONREACTIVE
Hematocrit: 38.2 % (ref 34.0–46.6)
Hemoglobin: 12.4 g/dL (ref 11.1–15.9)
Hepatitis B Surface Ag: NEGATIVE
IMMATURE GRANS (ABS): 0 10*3/uL (ref 0.0–0.1)
IMMATURE GRANULOCYTES: 0 %
LYMPHS ABS: 1.3 10*3/uL (ref 0.7–3.1)
Lymphs: 10 %
MCH: 30.5 pg (ref 26.6–33.0)
MCHC: 32.5 g/dL (ref 31.5–35.7)
MCV: 94 fL (ref 79–97)
MONOS ABS: 0.7 10*3/uL (ref 0.1–0.9)
Monocytes: 5 %
NEUTROS PCT: 85 %
Neutrophils Absolute: 11.6 10*3/uL — ABNORMAL HIGH (ref 1.4–7.0)
PLATELETS: 301 10*3/uL (ref 150–450)
RBC: 4.06 x10E6/uL (ref 3.77–5.28)
RDW: 13.9 % (ref 12.3–15.4)
RPR Ser Ql: NONREACTIVE
Rh Factor: POSITIVE
Rubella Antibodies, IGG: 9.89 index (ref >0.99)
WBC: 13.7 10*3/uL — AB (ref 3.4–10.8)

## 2018-07-05 LAB — HEMOGLOBIN A1C
ESTIMATED AVERAGE GLUCOSE: 97 mg/dL
HEMOGLOBIN A1C: 5 % (ref 4.8–5.6)

## 2018-07-05 LAB — HEMOGLOBINOPATHY EVALUATION
HEMOGLOBIN A2 QUANTITATION: 2.7 % (ref 1.8–3.2)
HEMOGLOBIN F QUANTITATION: 0 % (ref 0.0–2.0)
HGB C: 0 %
HGB S: 0 %
HGB VARIANT: 0 %
Hgb A: 97.3 % (ref 96.4–98.8)

## 2018-07-06 ENCOUNTER — Encounter: Payer: Self-pay | Admitting: Obstetrics and Gynecology

## 2018-07-06 ENCOUNTER — Other Ambulatory Visit: Payer: Self-pay

## 2018-07-06 ENCOUNTER — Telehealth: Payer: Self-pay

## 2018-07-06 LAB — URINE CULTURE, OB REFLEX

## 2018-07-06 LAB — CULTURE, OB URINE

## 2018-07-06 MED ORDER — VITAFOL GUMMIES 3.33-0.333-34.8 MG PO CHEW: 3.0000 | CHEWABLE_TABLET | Freq: Every day | ORAL | 6 refills | Status: DC

## 2018-07-06 NOTE — Progress Notes (Signed)
[facility] needed clarification on the Vitafol Gummies. Rx changed to 3 x daily 90 tab.

## 2018-07-06 NOTE — Telephone Encounter (Signed)
PA received for Diclegis 07/06/18 PA #[id number] Effective 07/06/18-07/01/19

## 2018-07-07 LAB — CYSTIC FIBROSIS MUTATION 97: GENE DIS ANAL CARRIER INTERP BLD/T-IMP: NOT DETECTED

## 2018-07-09 LAB — SMN1 COPY NUMBER ANALYSIS (SMA CARRIER SCREENING)

## 2018-07-29 ENCOUNTER — Encounter: Payer: Self-pay | Admitting: Obstetrics and Gynecology

## 2018-07-29 ENCOUNTER — Ambulatory Visit (INDEPENDENT_AMBULATORY_CARE_PROVIDER_SITE_OTHER): Payer: Medicaid Other | Admitting: Obstetrics and Gynecology

## 2018-07-29 VITALS — BP 119/76 | HR 87 | Wt 182.0 lb

## 2018-07-29 DIAGNOSIS — Z3400 Encounter for supervision of normal first pregnancy, unspecified trimester: Secondary | ICD-10-CM

## 2018-07-29 NOTE — Progress Notes (Signed)
   PRENATAL VISIT NOTE  Subjective:  Alice Humphrey is a 22 y.o. G1P0 at 4272w6d being seen today for ongoing prenatal care.  She is currently monitored for the following issues for this low-risk pregnancy and has Encounter for supervision of normal pregnancy, unspecified, unspecified trimester on their problem list.  Patient reports no complaints.  Contractions: Not present. Vag. Bleeding: None.  Movement: Absent. Denies leaking of fluid.   The following portions of the patient's history were reviewed and updated as appropriate: allergies, current medications, past family history, past medical history, past social history, past surgical history and problem list. Problem list updated.  Objective:   Vitals:   07/29/18 1513  BP: 119/76  Pulse: 87  Weight: 182 lb (82.6 kg)    Fetal Status: Fetal Heart Rate (bpm): 158   Movement: Absent     General:  Alert, oriented and cooperative. Patient is in no acute distress.  Skin: Skin is warm and dry. No rash noted.   Cardiovascular: Normal heart rate noted  Respiratory: Normal respiratory effort, no problems with respiration noted  Abdomen: Soft, gravid, appropriate for gestational age.  Pain/Pressure: Absent     Pelvic: Cervical exam deferred        Extremities: Normal range of motion.  Edema: None  Mental Status: Normal mood and affect. Normal behavior. Normal judgment and thought content.   Assessment and Plan:  Pregnancy: G1P0 at 4072w6d  1. Supervision of normal first pregnancy, antepartum Patient is doing well without complaints AFP next visit Anatomy ultrasound ordered - US MFM OB COMP + 14 WK; Future  Preterm labor symptoms and general obstetric precautions including but not limited to vaginal bleeding, contractions, leaking of fluid and fetal movement were reviewed in detail with the patient. Please refer to After Visit Summary for other counseling recommendations.  Return in about 4 weeks (around 08/26/2018) for ROB.  Future  Appointments  Date Time Provider Department Center  07/29/2018  4:00 PM Kevonta Phariss, Gigi GinPeggy, MD [facility] None    Catalina AntiguaPeggy Drina Jobst, MD

## 2018-08-20 ENCOUNTER — Encounter (HOSPITAL_COMMUNITY): Payer: Self-pay

## 2018-08-25 ENCOUNTER — Ambulatory Visit (INDEPENDENT_AMBULATORY_CARE_PROVIDER_SITE_OTHER): Payer: Medicaid Other | Admitting: Obstetrics & Gynecology

## 2018-08-25 ENCOUNTER — Encounter: Payer: Self-pay | Admitting: Obstetrics & Gynecology

## 2018-08-25 DIAGNOSIS — Z23 Encounter for immunization: Secondary | ICD-10-CM | POA: Diagnosis not present

## 2018-08-25 DIAGNOSIS — Z3402 Encounter for supervision of normal first pregnancy, second trimester: Secondary | ICD-10-CM

## 2018-08-25 DIAGNOSIS — Z3400 Encounter for supervision of normal first pregnancy, unspecified trimester: Secondary | ICD-10-CM

## 2018-08-25 NOTE — Patient Instructions (Signed)
Back Pain in Pregnancy Back pain during pregnancy is common. Back pain may be caused by several factors that are related to changes during your pregnancy. Follow these instructions at home: Managing pain, stiffness, and swelling  If directed, apply ice for sudden (acute) back pain. ? Put ice in a plastic bag. ? Place a towel between your skin and the bag. ? Leave the ice on for 20 minutes, 2-3 times per day.  If directed, apply heat to the affected area before you exercise: ? Place a towel between your skin and the heat pack or heating pad. ? Leave the heat on for 20-30 minutes. ? Remove the heat if your skin turns bright red. This is especially important if you are unable to feel pain, heat, or cold. You may have a greater risk of getting burned. Activity  Exercise as told by your health care provider. Exercising is the best way to prevent or manage back pain.  Listen to your body when lifting. If lifting hurts, ask for help or bend your knees. This uses your leg muscles instead of your back muscles.  Squat down when picking up something from the floor. Do not bend over.  Only use bed rest as told by your health care provider. Bed rest should only be used for the most severe episodes of back pain. Standing, Sitting, and Lying Down  Do not stand in one place for long periods of time.  Use good posture when sitting. Make sure your head rests over your shoulders and is not hanging forward. Use a pillow on your lower back if necessary.  Try sleeping on your side, preferably the left side, with a pillow or two between your legs. If you are sore after a night's rest, your bed may be too soft. A firm mattress may provide more support for your back during pregnancy. General instructions  Do not wear high heels.  Eat a healthy diet. Try to gain weight within your health care provider's recommendations.  Use a maternity girdle, elastic sling, or back brace as told by your health care  provider.  Take over-the-counter and prescription medicines only as told by your health care provider.  Keep all follow-up visits as told by your health care provider. This is important. This includes any visits with any specialists, such as a physical therapist. Contact a health care provider if:  Your back pain interferes with your daily activities.  You have increasing pain in other parts of your body. Get help right away if:  You develop numbness, tingling, weakness, or problems with the use of your arms or legs.  You develop severe back pain that is not controlled with medicine.  You have a sudden change in bowel or bladder control.  You develop shortness of breath, dizziness, or you faint.  You develop nausea, vomiting, or sweating.  You have back pain that is a rhythmic, cramping pain similar to labor pains. Labor pain is usually 1-2 minutes apart, lasts for about 1 minute, and involves a bearing down feeling or pressure in your pelvis.  You have back pain and your water breaks or you have vaginal bleeding.  You have back pain or numbness that travels down your leg.  Your back pain developed after you fell.  You develop pain on one side of your back.  You see blood in your urine.  You develop skin blisters in the area of your back pain. This information is not intended to replace advice given to you   by your health care provider. Make sure you discuss any questions you have with your health care provider. Document Released: 03/18/2006 Document Revised: 05/15/2016 Document Reviewed: 08/22/2015 Elsevier Interactive Patient Education  2018 Elsevier Inc.  

## 2018-08-25 NOTE — Progress Notes (Signed)
   PRENATAL VISIT NOTE  Subjective:  Alice Humphrey is a 22 y.o. G1P0 at [redacted]w[redacted]d being seen today for ongoing prenatal care.  She is currently monitored for the following issues for this low-risk pregnancy and has Encounter for supervision of normal pregnancy, unspecified, unspecified trimester on their problem list.  Patient reports backache and nausea.  Contractions: Not present. Vag. Bleeding: None.  Movement: Absent. Denies leaking of fluid.   The following portions of the patient's history were reviewed and updated as appropriate: allergies, current medications, past family history, past medical history, past social history, past surgical history and problem list. Problem list updated.  Objective:   Vitals:   08/25/18 1511  BP: 122/77  Pulse: 87  Weight: 185 lb 4.8 oz (84.1 kg)    Fetal Status: Fetal Heart Rate (bpm): 156 Fundal Height: 20 cm Movement: Absent     General:  Alert, oriented and cooperative. Patient is in no acute distress.  Skin: Skin is warm and dry. No rash noted.   Cardiovascular: Normal heart rate noted  Respiratory: Normal respiratory effort, no problems with respiration noted  Abdomen: Soft, gravid, appropriate for gestational age.  Pain/Pressure: Absent     Pelvic: Cervical exam deferred        Extremities: Normal range of motion.  Edema: None  Mental Status: Normal mood and affect. Normal behavior. Normal judgment and thought content.   Assessment and Plan:  Pregnancy: G1P0 at [redacted]w[redacted]d  1. Supervision of normal first pregnancy, antepartum  - Flu Vaccine QUAD 36+ mos IM (Fluarix, Quad PF)  Preterm labor symptoms and general obstetric precautions including but not limited to vaginal bleeding, contractions, leaking of fluid and fetal movement were reviewed in detail with the patient. Please refer to After Visit Summary for other counseling recommendations.  Return in about 4 weeks (around 09/22/2018).  Future Appointments  Date Time Provider Department  Center  08/27/2018  3:15 PM [facility] [place] 4 [facility]    Scheryl Darter, MD

## 2018-08-26 ENCOUNTER — Encounter: Payer: Self-pay | Admitting: *Deleted

## 2018-08-27 ENCOUNTER — Ambulatory Visit (HOSPITAL_COMMUNITY)
Admission: RE | Admit: 2018-08-27 | Discharge: 2018-08-27 | Disposition: A | Discharge status: Home / Self Care | Payer: Medicaid Other | Source: Ambulatory Visit | Attending: Obstetrics and Gynecology | Admitting: Obstetrics and Gynecology

## 2018-08-27 DIAGNOSIS — Z363 Encounter for antenatal screening for malformations: Secondary | ICD-10-CM | POA: Insufficient documentation

## 2018-08-27 DIAGNOSIS — Z3A19 19 weeks gestation of pregnancy: Secondary | ICD-10-CM | POA: Diagnosis not present

## 2018-08-27 DIAGNOSIS — Z3400 Encounter for supervision of normal first pregnancy, unspecified trimester: Secondary | ICD-10-CM

## 2018-08-30 ENCOUNTER — Other Ambulatory Visit (HOSPITAL_COMMUNITY): Payer: Self-pay | Admitting: *Deleted

## 2018-08-30 DIAGNOSIS — Z362 Encounter for other antenatal screening follow-up: Secondary | ICD-10-CM

## 2018-09-22 ENCOUNTER — Encounter: Payer: Self-pay | Admitting: Obstetrics & Gynecology

## 2018-09-22 ENCOUNTER — Other Ambulatory Visit (HOSPITAL_COMMUNITY)
Admission: RE | Admit: 2018-09-22 | Discharge: 2018-09-22 | Disposition: A | Discharge status: Home / Self Care | Payer: Medicaid Other | Source: Ambulatory Visit | Attending: Obstetrics & Gynecology | Admitting: Obstetrics & Gynecology

## 2018-09-22 ENCOUNTER — Ambulatory Visit (INDEPENDENT_AMBULATORY_CARE_PROVIDER_SITE_OTHER): Payer: Medicaid Other | Admitting: Obstetrics & Gynecology

## 2018-09-22 VITALS — BP 119/75 | HR 90 | Wt 191.0 lb

## 2018-09-22 DIAGNOSIS — Z3400 Encounter for supervision of normal first pregnancy, unspecified trimester: Secondary | ICD-10-CM | POA: Insufficient documentation

## 2018-09-22 DIAGNOSIS — Z3A00 Weeks of gestation of pregnancy not specified: Secondary | ICD-10-CM | POA: Diagnosis not present

## 2018-09-22 DIAGNOSIS — N898 Other specified noninflammatory disorders of vagina: Secondary | ICD-10-CM

## 2018-09-22 NOTE — Progress Notes (Signed)
Pt c/o vaginal discharge with odor and itching x month.

## 2018-09-22 NOTE — Patient Instructions (Signed)
Second Trimester of Pregnancy The second trimester is from week 13 through week 28, month 4 through 6. This is often the time in pregnancy that you feel your best. Often times, morning sickness has lessened or quit. You may have more energy, and you may get hungry more often. Your unborn baby (fetus) is growing rapidly. At the end of the sixth month, he or she is about 9 inches long and weighs about 1 pounds. You will likely feel the baby move (quickening) between 18 and 20 weeks of pregnancy. Follow these instructions at home:  Avoid all smoking, herbs, and alcohol. Avoid drugs not approved by your doctor.  Do not use any tobacco products, including cigarettes, chewing tobacco, and electronic cigarettes. If you need help quitting, ask your doctor. You may get counseling or other support to help you quit.  Only take medicine as told by your doctor. Some medicines are safe and some are not during pregnancy.  Exercise only as told by your doctor. Stop exercising if you start having cramps.  Eat regular, healthy meals.  Wear a good support bra if your breasts are tender.  Do not use hot tubs, steam rooms, or saunas.  Wear your seat belt when driving.  Avoid raw meat, uncooked cheese, and liter boxes and soil used by cats.  Take your prenatal vitamins.  Take 1500-2000 milligrams of calcium daily starting at the 20th week of pregnancy until you deliver your baby.  Try taking medicine that helps you poop (stool softener) as needed, and if your doctor approves. Eat more fiber by eating fresh fruit, vegetables, and whole grains. Drink enough fluids to keep your pee (urine) clear or pale yellow.  Take warm water baths (sitz baths) to soothe pain or discomfort caused by hemorrhoids. Use hemorrhoid cream if your doctor approves.  If you have puffy, bulging veins (varicose veins), wear support hose. Raise (elevate) your feet for 15 minutes, 3-4 times a day. Limit salt in your diet.  Avoid heavy  lifting, wear low heals, and sit up straight.  Rest with your legs raised if you have leg cramps or low back pain.  Visit your dentist if you have not gone during your pregnancy. Use a soft toothbrush to brush your teeth. Be gentle when you floss.  You can have sex (intercourse) unless your doctor tells you not to.  Go to your doctor visits. Get help if:  You feel dizzy.  You have mild cramps or pressure in your lower belly (abdomen).  You have a nagging pain in your belly area.  You continue to feel sick to your stomach (nauseous), throw up (vomit), or have watery poop (diarrhea).  You have bad smelling fluid coming from your vagina.  You have pain with peeing (urination). Get help right away if:  You have a fever.  You are leaking fluid from your vagina.  You have spotting or bleeding from your vagina.  You have severe belly cramping or pain.  You lose or gain weight rapidly.  You have trouble catching your breath and have chest pain.  You notice sudden or extreme puffiness (swelling) of your face, hands, ankles, feet, or legs.  You have not felt the baby move in over an hour.  You have severe headaches that do not go away with medicine.  You have vision changes. This information is not intended to replace advice given to you by your health care provider. Make sure you discuss any questions you have with your health care   provider. Document Released: 03/04/2010 Document Revised: 05/15/2016 Document Reviewed: 02/08/2013 Elsevier Interactive Patient Education  2017 [employer]  

## 2018-09-22 NOTE — Progress Notes (Signed)
   PRENATAL VISIT NOTE  Subjective:  Alice Humphrey is a 22 y.o. G1P0 at [redacted]w[redacted]d being seen today for ongoing prenatal care.  She is currently monitored for the following issues for this low-risk pregnancy and has Encounter for supervision of normal pregnancy, unspecified, unspecified trimester on their problem list.  Patient reports vaginal irritation.  Contractions: Not present. Vag. Bleeding: None.  Movement: Present. Denies leaking of fluid.   The following portions of the patient's history were reviewed and updated as appropriate: allergies, current medications, past family history, past medical history, past social history, past surgical history and problem list. Problem list updated.  Objective:   Vitals:   09/22/18 1509 09/22/18 1556  BP: 140/83 119/75  Pulse: 90   Weight: 191 lb (86.6 kg)     Fetal Status: Fetal Heart Rate (bpm): 150 Fundal Height: 22 cm Movement: Present     General:  Alert, oriented and cooperative. Patient is in no acute distress.  Skin: Skin is warm and dry. No rash noted.   Cardiovascular: Normal heart rate noted  Respiratory: Normal respiratory effort, no problems with respiration noted  Abdomen: Soft, gravid, appropriate for gestational age.  Pain/Pressure: Absent     Pelvic: Cervical exam performed        Extremities: Normal range of motion.  Edema: None  Mental Status: Normal mood and affect. Normal behavior. Normal judgment and thought content.   Assessment and Plan:  Pregnancy: G1P0 at [redacted]w[redacted]d  1. Supervision of normal first pregnancy, antepartum Vaginal irritation - Cervicovaginal ancillary only  2. Vaginal discharge Specimen sent  Preterm labor symptoms and general obstetric precautions including but not limited to vaginal bleeding, contractions, leaking of fluid and fetal movement were reviewed in detail with the patient. Please refer to After Visit Summary for other counseling recommendations.  Return in about 4 weeks (around  10/20/2018).  Future Appointments  Date Time Provider Department Center  09/24/2018  3:30 PM [facility] [place] 1 [facility]    Scheryl Darter, MD

## 2018-09-24 ENCOUNTER — Ambulatory Visit (HOSPITAL_COMMUNITY)
Admission: RE | Admit: 2018-09-24 | Discharge: 2018-09-24 | Disposition: A | Discharge status: Home / Self Care | Payer: Medicaid Other | Source: Ambulatory Visit | Attending: Obstetrics and Gynecology | Admitting: Obstetrics and Gynecology

## 2018-09-24 DIAGNOSIS — Z362 Encounter for other antenatal screening follow-up: Secondary | ICD-10-CM | POA: Diagnosis present

## 2018-09-24 DIAGNOSIS — Z3A23 23 weeks gestation of pregnancy: Secondary | ICD-10-CM | POA: Insufficient documentation

## 2018-09-24 LAB — CERVICOVAGINAL ANCILLARY ONLY
Bacterial vaginitis: NEGATIVE
Candida vaginitis: NEGATIVE
Chlamydia: NEGATIVE
NEISSERIA GONORRHEA: NEGATIVE
TRICH (WINDOWPATH): NEGATIVE

## 2018-09-24 IMAGING — US US MFM OB FOLLOW-UP
1 series · 14 of 28 positions shown · non-contrast
Comparison: none

[Series 1: us mfm ob follow-up · 35 acquisitions, 14 frames shown]
[im 2/35]
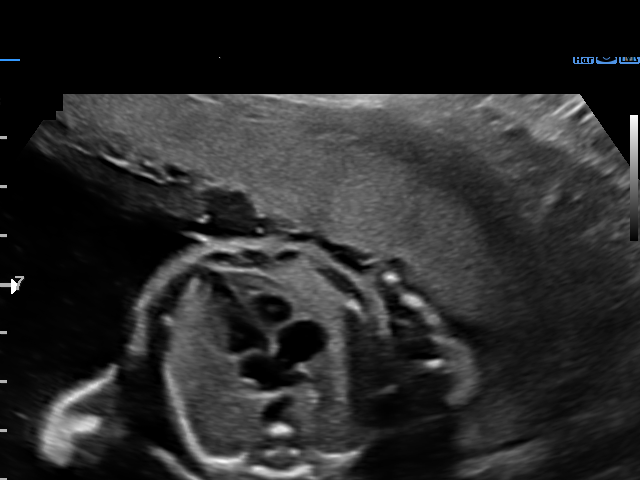
[im 4/35]
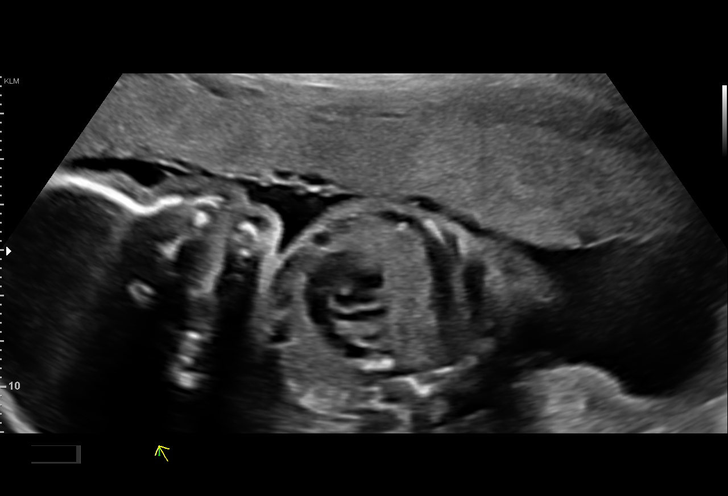
[im 7/35]
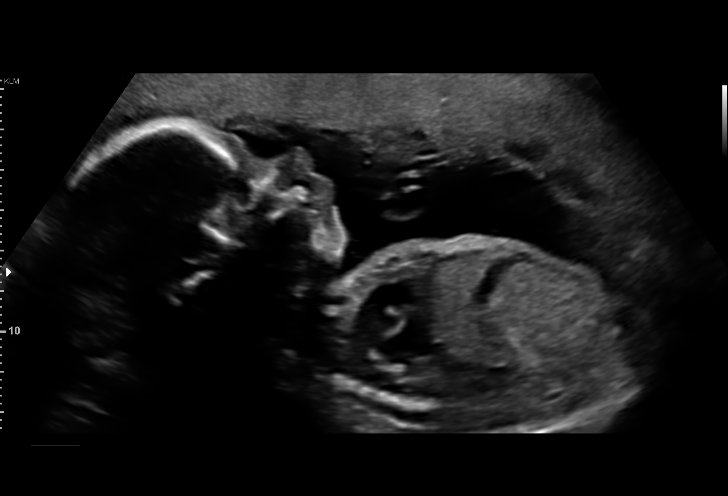
[im 9/35]
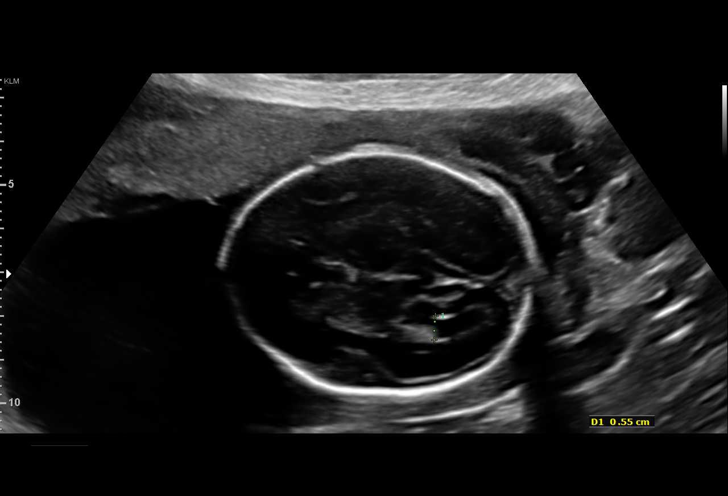
[im 12/35]
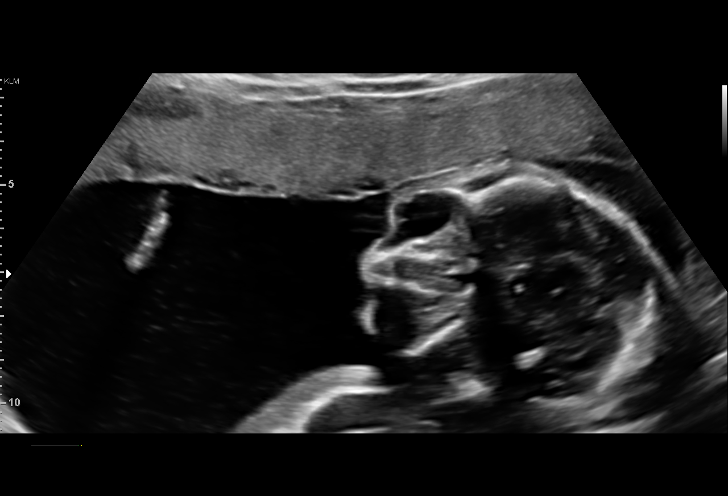
[im 14/35]
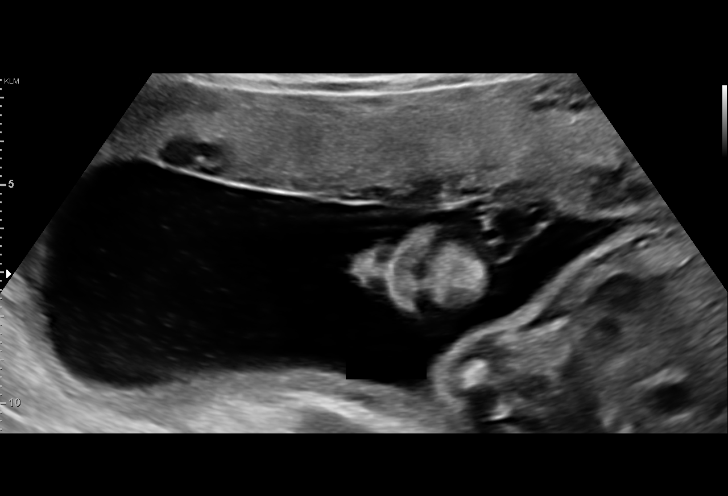
[im 17/35]
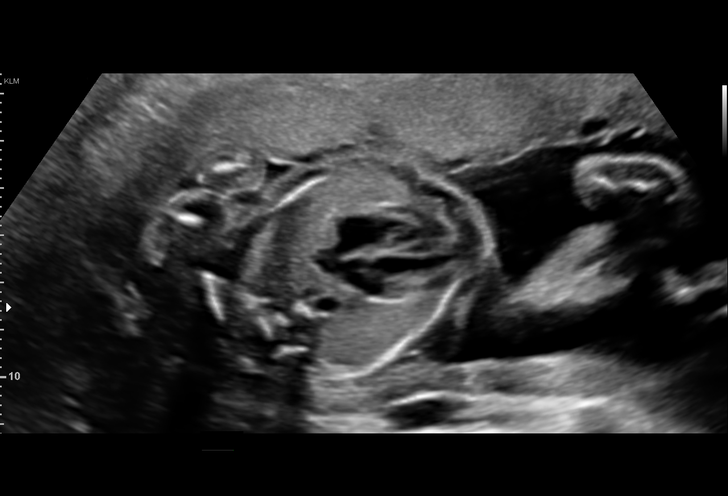
[im 19/35]
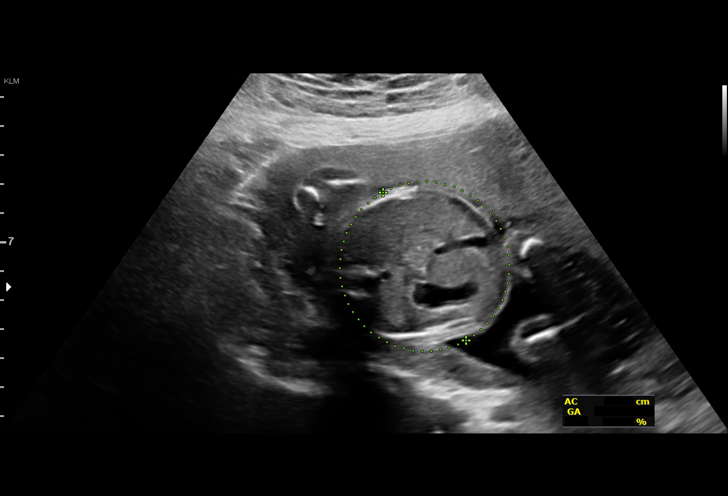
[im 22/35]
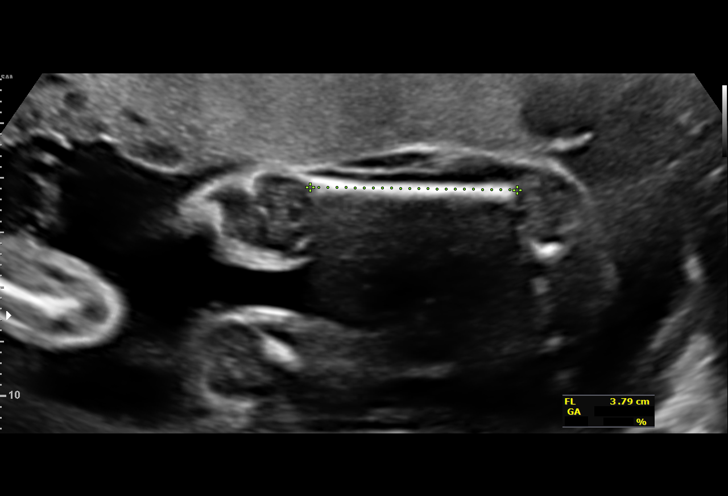
[im 24/35]
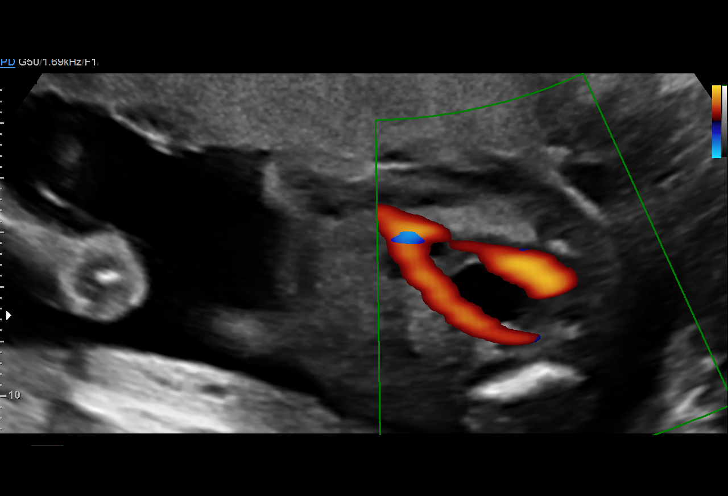
[im 27/35]
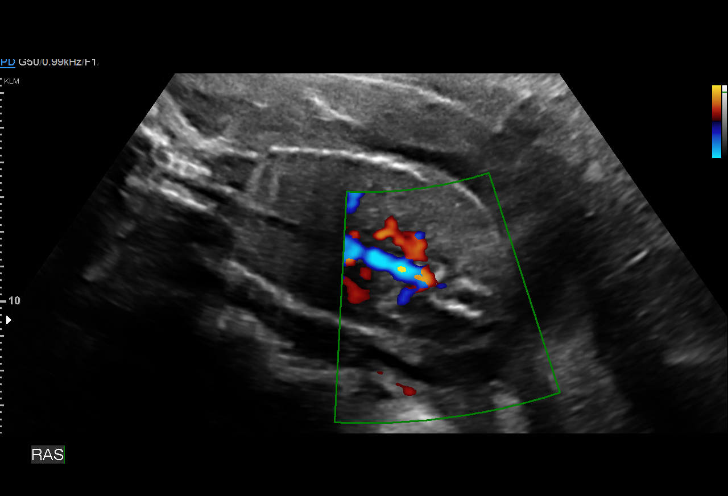
[im 29/35]
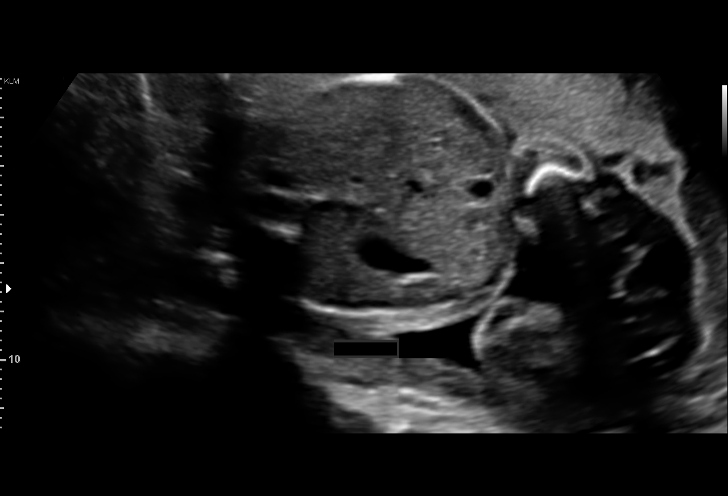
[im 32/35]
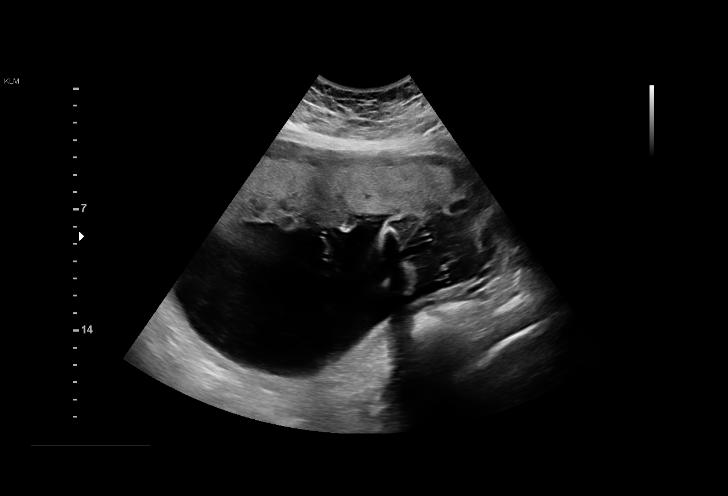
[im 35/35]
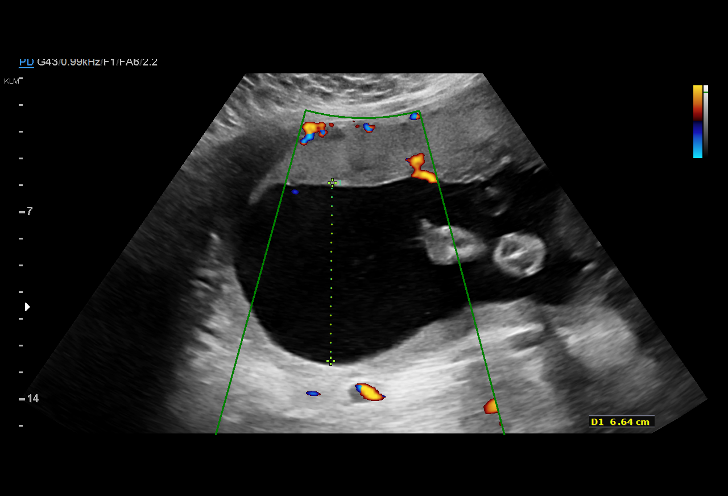

[14 of 28 positions shown; findings below may reference images not displayed]

Indications

Antenatal follow-up for nonvisualized fetal    [S4]
anatomy
23 weeks gestation of pregnancy
Vital Signs

Height:        4'1"
Fetal Evaluation

Num Of Fetuses:         1
Fetal Heart Rate(bpm):  162
Cardiac Activity:       Observed
Presentation:           Transverse, head to maternal right
Placenta:               Anterior
P. Cord Insertion:      Visualized

Amniotic Fluid
AFI FV:      Within normal limits
Biometry

BPD:      54.7  mm     G. Age:  22w 5d         32  %    CI:        72.34   %    70 - 86
FL/HC:      18.3   %    19.2 -
HC:      204.6  mm     G. Age:  22w 4d         21  %    HC/AC:      1.10        1.05 -
AC:      186.5  mm     G. Age:  23w 3d         56  %    FL/BPD:     68.6   %    71 - 87
FL:       37.5  mm     G. Age:  22w 0d         12  %    FL/AC:      20.1   %    20 - 24

Est. FW:     532  gm      1 lb 3 oz     50  %
OB History

Gravidity:    1
Gestational Age

LMP:           23w 0d        Date:  [DATE]                 EDD:   [DATE]
U/S Today:     22w 5d                                        EDD:   [DATE]
Best:          23w 0d     Det. By:  LMP  ([DATE])          EDD:   [DATE]
Anatomy

Cranium:               Appears normal         Aortic Arch:            Appears normal
Cavum:                 Appears normal         Ductal Arch:            Appears normal
Ventricles:            Appears normal         Diaphragm:              Appears normal
Choroid Plexus:        Appears normal         Stomach:                Appears normal, left
sided
Cerebellum:            Previously seen        Abdomen:                Appears normal
Posterior Fossa:       Previously seen        Abdominal Wall:         Previously seen
Nuchal Fold:           Previously seen        Cord Vessels:           Appears normal (3
vessel cord)
Face:                  Appears normal         Kidneys:                Appear normal
(orbits and profile)
Lips:                  Appears normal         Bladder:                Appears normal
Thoracic:              Appears normal         Spine:                  Previously seen
Heart:                 Appears normal         Upper Extremities:      Previously seen
(4CH, axis, and situs
RVOT:                  Appears normal         Lower Extremities:      Previously seen
LVOT:                  Appears normal

Other:  Female gender, 5th digit previously visualized. Heels visualized.
Nasal bone visualized.
Cervix Uterus Adnexa

Cervix
Length:           3.78  cm.
Normal appearance by transabdominal scan.
Impression

Patient returned for completion of fetal anatomy. Amniotic
fluid is normal and good fetal activity is seen. Fetal biometry
is consistent with her previously-established dates.
CSP, profile and RVOT appear normal. Fetal anatomical
survey was completed.
Recommendations

Follow-up scans as clinically indicated.

## 2018-10-20 ENCOUNTER — Ambulatory Visit (INDEPENDENT_AMBULATORY_CARE_PROVIDER_SITE_OTHER): Payer: Medicaid Other | Admitting: Obstetrics and Gynecology

## 2018-10-20 ENCOUNTER — Other Ambulatory Visit: Payer: Self-pay

## 2018-10-20 ENCOUNTER — Encounter: Payer: Self-pay | Admitting: Obstetrics and Gynecology

## 2018-10-20 VITALS — BP 130/70 | HR 109 | Wt 191.0 lb

## 2018-10-20 DIAGNOSIS — Z3400 Encounter for supervision of normal first pregnancy, unspecified trimester: Secondary | ICD-10-CM

## 2018-10-20 NOTE — Progress Notes (Signed)
   PRENATAL VISIT NOTE  Subjective:  Alice Humphrey is a 22 y.o. G1P0 at [redacted]w[redacted]d being seen today for ongoing prenatal care.  She is currently monitored for the following issues for this low-risk pregnancy and has Encounter for supervision of normal pregnancy, unspecified, unspecified trimester on their problem list.  Patient reports no complaints.  Contractions: Not present. Vag. Bleeding: None.  Movement: Present. Denies leaking of fluid.   The following portions of the patient's history were reviewed and updated as appropriate: allergies, current medications, past family history, past medical history, past social history, past surgical history and problem list. Problem list updated.  Objective:   Vitals:   10/20/18 1526  BP: 130/70  Pulse: (!) 109  Weight: 191 lb (86.6 kg)    Fetal Status: Fetal Heart Rate (bpm): 154 Fundal Height: 26 cm Movement: Present     General:  Alert, oriented and cooperative. Patient is in no acute distress.  Skin: Skin is warm and dry. No rash noted.   Cardiovascular: Normal heart rate noted  Respiratory: Normal respiratory effort, no problems with respiration noted  Abdomen: Soft, gravid, appropriate for gestational age.  Pain/Pressure: Absent     Pelvic: Cervical exam deferred        Extremities: Normal range of motion.  Edema: None  Mental Status: Normal mood and affect. Normal behavior. Normal judgment and thought content.   Assessment and Plan:  Pregnancy: G1P0 at [redacted]w[redacted]d  1. Supervision of normal first pregnancy, antepartum Patient is doing well without complaints Advised to use hydrocortisone cream on skin rash Third trimester labs next visit Patient planning on getting tdap at her mother's pharmacy  Preterm labor symptoms and general obstetric precautions including but not limited to vaginal bleeding, contractions, leaking of fluid and fetal movement were reviewed in detail with the patient. Please refer to After Visit Summary for other  counseling recommendations.  Return in about 2 weeks (around 11/03/2018) for ROB, 2 hr glucola next visit.  Future Appointments  Date Time Provider Department Center  10/20/2018  3:45 PM Kaizlee Carlino, Gigi Gin, MD [facility] None    Catalina Antigua, MD

## 2018-10-20 NOTE — Progress Notes (Signed)
ROB. C/o itchy rash around her mouth x 3 months.

## 2018-11-04 ENCOUNTER — Other Ambulatory Visit: Payer: Medicaid Other

## 2018-11-04 ENCOUNTER — Ambulatory Visit (INDEPENDENT_AMBULATORY_CARE_PROVIDER_SITE_OTHER): Payer: Medicaid Other | Admitting: Obstetrics and Gynecology

## 2018-11-04 ENCOUNTER — Encounter: Payer: Self-pay | Admitting: Obstetrics and Gynecology

## 2018-11-04 VITALS — BP 117/76 | HR 93 | Wt 195.0 lb

## 2018-11-04 DIAGNOSIS — Z3400 Encounter for supervision of normal first pregnancy, unspecified trimester: Secondary | ICD-10-CM

## 2018-11-04 DIAGNOSIS — Z3A28 28 weeks gestation of pregnancy: Secondary | ICD-10-CM

## 2018-11-04 DIAGNOSIS — Z3403 Encounter for supervision of normal first pregnancy, third trimester: Secondary | ICD-10-CM

## 2018-11-04 NOTE — Progress Notes (Signed)
   PRENATAL VISIT NOTE  Subjective:  Alice Humphrey is a 22 y.o. G1P0 at 7829w6d being seen today for ongoing prenatal care.  She is currently monitored for the following issues for this low-risk pregnancy and has Encounter for supervision of normal pregnancy, unspecified, unspecified trimester on their problem list.  Patient reports no complaints.  Contractions: Not present. Vag. Bleeding: None.  Movement: Present. Denies leaking of fluid.   The following portions of the patient's history were reviewed and updated as appropriate: allergies, current medications, past family history, past medical history, past social history, past surgical history and problem list. Problem list updated.  Objective:   Vitals:   11/04/18 0921  BP: 117/76  Pulse: 93  Weight: 195 lb (88.5 kg)    Fetal Status: Fetal Heart Rate (bpm): 160 Fundal Height: 29 cm Movement: Present     General:  Alert, oriented and cooperative. Patient is in no acute distress.  Skin: Skin is warm and dry. No rash noted.   Cardiovascular: Normal heart rate noted  Respiratory: Normal respiratory effort, no problems with respiration noted  Abdomen: Soft, gravid, appropriate for gestational age.  Pain/Pressure: Absent     Pelvic: Cervical exam deferred        Extremities: Normal range of motion.     Mental Status: Normal mood and affect. Normal behavior. Normal judgment and thought content.   Assessment and Plan:  Pregnancy: G1P0 at 8329w6d  1. Supervision of normal first pregnancy, antepartum - Glucose Tolerance, 2 Hours w/1 Hour - CBC - HIV Antibody (routine testing w rflx) - RPR   Preterm labor symptoms and general obstetric precautions including but not limited to vaginal bleeding, contractions, leaking of fluid and fetal movement were reviewed in detail with the patient. Please refer to After Visit Summary for other counseling recommendations.  Return in about 2 weeks (around 11/18/2018) for OB visit.  No future  appointments.  Conan BowensKelly M Davis, MD

## 2018-11-04 NOTE — Progress Notes (Signed)
Pt complains of back pain today.

## 2018-11-05 LAB — CBC
Hematocrit: 31.4 % — ABNORMAL LOW (ref 34.0–46.6)
Hemoglobin: 10.7 g/dL — ABNORMAL LOW (ref 11.1–15.9)
MCH: 31.4 pg (ref 26.6–33.0)
MCHC: 34.1 g/dL (ref 31.5–35.7)
MCV: 92 fL (ref 79–97)
Platelets: 284 10*3/uL (ref 150–450)
RBC: 3.41 x10E6/uL — AB (ref 3.77–5.28)
RDW: 12.7 % (ref 12.3–15.4)
WBC: 12.8 10*3/uL — ABNORMAL HIGH (ref 3.4–10.8)

## 2018-11-05 LAB — HIV ANTIBODY (ROUTINE TESTING W REFLEX): HIV SCREEN 4TH GENERATION: NONREACTIVE

## 2018-11-05 LAB — GLUCOSE TOLERANCE, 2 HOURS W/ 1HR
Glucose, 1 hour: 148 mg/dL (ref 65–179)
Glucose, 2 hour: 105 mg/dL (ref 65–152)
Glucose, Fasting: 83 mg/dL (ref 65–91)

## 2018-11-05 LAB — RPR: RPR Ser Ql: NONREACTIVE

## 2018-11-16 ENCOUNTER — Ambulatory Visit (INDEPENDENT_AMBULATORY_CARE_PROVIDER_SITE_OTHER): Payer: Medicaid Other | Admitting: Obstetrics & Gynecology

## 2018-11-16 ENCOUNTER — Encounter: Payer: Self-pay | Admitting: Obstetrics & Gynecology

## 2018-11-16 DIAGNOSIS — Z3403 Encounter for supervision of normal first pregnancy, third trimester: Secondary | ICD-10-CM

## 2018-11-16 DIAGNOSIS — Z3400 Encounter for supervision of normal first pregnancy, unspecified trimester: Secondary | ICD-10-CM

## 2018-11-16 NOTE — Progress Notes (Signed)
   PRENATAL VISIT NOTE  Subjective:  Alice Humphrey is a 22 y.o. G1P0 at 2262w4d being seen today for ongoing prenatal care.  She is currently monitored for the following issues for this low-risk pregnancy and has Encounter for supervision of normal pregnancy, unspecified, unspecified trimester on their problem list.  Patient reports no complaints.  Contractions: Not present. Vag. Bleeding: None.  Movement: Present. Denies leaking of fluid.   The following portions of the patient's history were reviewed and updated as appropriate: allergies, current medications, past family history, past medical history, past social history, past surgical history and problem list. Problem list updated.  Objective:   Vitals:   11/16/18 1547  BP: 112/74  Pulse: (!) 106  Weight: 197 lb (89.4 kg)    Fetal Status: Fetal Heart Rate (bpm): 138   Movement: Present     General:  Alert, oriented and cooperative. Patient is in no acute distress.  Skin: Skin is warm and dry. No rash noted.   Cardiovascular: Normal heart rate noted  Respiratory: Normal respiratory effort, no problems with respiration noted  Abdomen: Soft, gravid, appropriate for gestational age.  Pain/Pressure: Absent     Pelvic: Cervical exam deferred        Extremities: Normal range of motion.  Edema: None  Mental Status: Normal mood and affect. Normal behavior. Normal judgment and thought content.   Assessment and Plan:  Pregnancy: G1P0 at 5162w4d  1. Supervision of normal first pregnancy, antepartum Normal pregnancy  Preterm labor symptoms and general obstetric precautions including but not limited to vaginal bleeding, contractions, leaking of fluid and fetal movement were reviewed in detail with the patient. Please refer to After Visit Summary for other counseling recommendations.  Return in about 2 weeks (around 11/30/2018).  No future appointments.  Scheryl DarterJames Huong Luthi, MD

## 2018-11-16 NOTE — Patient Instructions (Signed)

## 2018-11-23 ENCOUNTER — Encounter: Payer: Self-pay | Admitting: *Deleted

## 2018-12-01 ENCOUNTER — Ambulatory Visit (INDEPENDENT_AMBULATORY_CARE_PROVIDER_SITE_OTHER): Payer: Medicaid Other | Admitting: Obstetrics & Gynecology

## 2018-12-01 DIAGNOSIS — Z3403 Encounter for supervision of normal first pregnancy, third trimester: Secondary | ICD-10-CM

## 2018-12-01 DIAGNOSIS — Z3400 Encounter for supervision of normal first pregnancy, unspecified trimester: Secondary | ICD-10-CM

## 2018-12-01 NOTE — Patient Instructions (Signed)

## 2018-12-01 NOTE — Progress Notes (Signed)
   PRENATAL VISIT NOTE  Subjective:  Alice Humphrey is a 22 y.o. G1P0 at 4152w5d being seen today for ongoing prenatal care.  She is currently monitored for the following issues for this low-risk pregnancy and has Encounter for supervision of normal pregnancy, unspecified, unspecified trimester on their problem list.  Patient reports mild right abdominal soreness.  Contractions: Not present. Vag. Bleeding: None.  Movement: Present. Denies leaking of fluid.   The following portions of the patient's history were reviewed and updated as appropriate: allergies, current medications, past family history, past medical history, past social history, past surgical history and problem list. Problem list updated.  Objective:   Vitals:   12/01/18 1613  BP: 134/79  Pulse: (!) 117  Weight: 90.7 kg    Fetal Status: Fetal Heart Rate (bpm): 123 Fundal Height: 33 cm Movement: Present     General:  Alert, oriented and cooperative. Patient is in no acute distress.  Skin: Skin is warm and dry. No rash noted.   Cardiovascular: Normal heart rate noted  Respiratory: Normal respiratory effort, no problems with respiration noted  Abdomen: Soft, gravid, appropriate for gestational age.  Pain/Pressure: Absent     Pelvic: Cervical exam deferred        Extremities: Normal range of motion.  Edema: None  Mental Status: Normal mood and affect. Normal behavior. Normal judgment and thought content.   Assessment and Plan:  Pregnancy: G1P0 at 3452w5d  1. Supervision of normal first pregnancy, antepartum Doing well, mild discomforts  Preterm labor symptoms and general obstetric precautions including but not limited to vaginal bleeding, contractions, leaking of fluid and fetal movement were reviewed in detail with the patient. Please refer to After Visit Summary for other counseling recommendations.  Return in about 2 weeks (around 12/15/2018).  No future appointments.  Scheryl DarterJames , MD

## 2018-12-01 NOTE — Progress Notes (Signed)
ROB.  C/o of isolated URQ pain 7/10 x 1 1/2 week.

## 2018-12-11 ENCOUNTER — Encounter (HOSPITAL_COMMUNITY): Payer: Self-pay | Admitting: *Deleted

## 2018-12-11 ENCOUNTER — Inpatient Hospital Stay (HOSPITAL_COMMUNITY)
Admission: AD | Admit: 2018-12-11 | Discharge: 2018-12-11 | Disposition: A | Discharge status: Home / Self Care | Payer: Medicaid Other | Source: Ambulatory Visit | Attending: Obstetrics and Gynecology | Admitting: Obstetrics and Gynecology

## 2018-12-11 DIAGNOSIS — Z3A34 34 weeks gestation of pregnancy: Secondary | ICD-10-CM

## 2018-12-11 DIAGNOSIS — O99333 Smoking (tobacco) complicating pregnancy, third trimester: Secondary | ICD-10-CM | POA: Diagnosis not present

## 2018-12-11 DIAGNOSIS — B379 Candidiasis, unspecified: Secondary | ICD-10-CM | POA: Diagnosis not present

## 2018-12-11 DIAGNOSIS — R030 Elevated blood-pressure reading, without diagnosis of hypertension: Secondary | ICD-10-CM | POA: Diagnosis not present

## 2018-12-11 DIAGNOSIS — O26893 Other specified pregnancy related conditions, third trimester: Secondary | ICD-10-CM | POA: Insufficient documentation

## 2018-12-11 DIAGNOSIS — Z8249 Family history of ischemic heart disease and other diseases of the circulatory system: Secondary | ICD-10-CM | POA: Insufficient documentation

## 2018-12-11 DIAGNOSIS — O4693 Antepartum hemorrhage, unspecified, third trimester: Secondary | ICD-10-CM | POA: Diagnosis present

## 2018-12-11 DIAGNOSIS — Z3400 Encounter for supervision of normal first pregnancy, unspecified trimester: Secondary | ICD-10-CM

## 2018-12-11 LAB — WET PREP, GENITAL
Clue Cells Wet Prep HPF POC: NONE SEEN
Sperm: NONE SEEN
TRICH WET PREP: NONE SEEN

## 2018-12-11 LAB — URINALYSIS, ROUTINE W REFLEX MICROSCOPIC
Bilirubin Urine: NEGATIVE
Glucose, UA: NEGATIVE mg/dL
Hgb urine dipstick: NEGATIVE
Ketones, ur: NEGATIVE mg/dL
LEUKOCYTES UA: NEGATIVE
NITRITE: NEGATIVE
PH: 7 (ref 5.0–8.0)
PROTEIN: NEGATIVE mg/dL
Specific Gravity, Urine: 1.005 (ref 1.005–1.030)

## 2018-12-11 MED ORDER — TERCONAZOLE 0.8 % VA CREA: 1.0000 | TOPICAL_CREAM | Freq: Every day | VAGINAL | 0 refills | Status: AC

## 2018-12-11 NOTE — MAU Note (Signed)
This morning when went to [facility] saw pink on tissue this am. None since then. Have spot on abd that has constant pain that is to R above umbilicus. Stomach tightening at times. Good FM

## 2018-12-11 NOTE — MAU Provider Note (Signed)
History     CSN: [id number]  Arrival date and time: 12/11/18 1906   First Provider Initiated Contact with Patient 12/11/18 1947      Chief Complaint  Patient presents with  . Vaginal Bleeding   HPI   Alice Humphrey is a 22 y.o. G1P0 female at 423w1d who presents to MAU with vaginal bleeding. Reports she noted minimal pink spotting when wiping this morning. Called the office and was instructed to come in for evaluation. Denies any further bleeding. Endorses pain to the right of her umbilicus that has been chronic for >1 month during pregnancy and unchanged with this episode of bleeding. Endorses increased amount of vaginal discharge; discharge has been asymptomatic. Patient is unsure if she has had any contractions but patient's mother reports she did palpate her abdomen tightening once this morning. Good fetal movement. No leaking of fluid. Last had sexual intercourse yesterday.   OB History    Gravida  1   Para      Term      Preterm      AB      Living        SAB      TAB      Ectopic      Multiple      Live Births              Past Medical History:  Diagnosis Date  . Pre-diabetes 2006    History reviewed. No pertinent surgical history.  Family History  Problem Relation Age of Onset  . AAA (abdominal aortic aneurysm) Mother   . Hypertension Maternal Grandmother   . Diabetes Maternal Grandmother   . Hypertension Maternal Grandfather   . Diabetes Maternal Grandfather   . COPD Maternal Grandfather     Social History   Tobacco Use  . Smoking status: Current Some Day Smoker  . Smokeless tobacco: Never Used  Substance Use Topics  . Alcohol use: Not Currently  . Drug use: Yes    Types: Marijuana    Comment: last smoked yesterday    Allergies: No Known Allergies  Medications Prior to Admission  Medication Sig Dispense Refill Last Dose  . Doxylamine-Pyridoxine (DICLEGIS) 10-10 MG TBEC Take 2 tablets by mouth at bedtime. If symptoms persist, add  one tablet in the morning and one in the afternoon 100 tablet 5 Taking  . Prenatal MV-Min-FA-Omega-3 (PRENATAL GUMMIES/DHA & FA) 0.4-32.5 MG CHEW Chew by mouth.   Taking    Review of Systems  Constitutional: Negative for activity change, chills and fever.  Respiratory: Negative for shortness of breath.   Cardiovascular: Negative for chest pain.  Gastrointestinal: Positive for abdominal pain. Negative for constipation, diarrhea, nausea and vomiting.  Genitourinary: Positive for vaginal bleeding and vaginal discharge. Negative for dysuria, flank pain, hematuria, pelvic pain, urgency and vaginal pain.  Musculoskeletal: Negative for back pain.  Skin: Negative for rash.  Neurological: Negative for dizziness and light-headedness.   Physical Exam   Blood pressure 131/74, pulse 84, temperature 98.5 F (36.9 C), resp. rate 18, height 4\' 11"  (1.499 m), weight 90.7 kg, last menstrual period 04/16/2018.  Physical Exam  Constitutional: She is oriented to person, place, and time. She appears well-developed and well-nourished. No distress.  HENT:  Head: Normocephalic and atraumatic.  Eyes: Conjunctivae and EOM are normal.  Neck: Normal range of motion. Neck supple.  Cardiovascular: Normal rate, regular rhythm and normal heart sounds.  Respiratory: Effort normal and breath sounds normal. No respiratory distress.  GI:  Soft. She exhibits no distension. There is no abdominal tenderness. There is no rebound and no guarding.  Gravid.    Genitourinary:    Genitourinary Comments: SSE performed. No bleeding noted. Cervix without lesions or friability. Moderate amount of white, think discharge present in vaginal vault. Digital exam performed without CMT. Cervix 0/thick/posterior.    Musculoskeletal: Normal range of motion.        General: No edema.  Neurological: She is alert and oriented to person, place, and time.  Skin: Skin is warm and dry. She is not diaphoretic.  Psychiatric: She has a normal mood  and affect. Her behavior is normal.   FHT: 150 bpm, moderate variability, +acels, no decels  Toco: None   MAU Course  Procedures  MDM SSE performed. Wet prep and cultures obtained. Does not warrant Rhogam, as blood type is O+. NST reactive.   Assessment and Plan   1. Vaginal bleeding in pregnancy, third trimester   2. Supervision of normal first pregnancy, antepartum   3. [redacted] weeks gestation of pregnancy   4. Yeast infection   5. Elevated blood-pressure reading without diagnosis of hypertension    No vaginal bleeding noted on exam. Suspect minimal spotting related to recent episode of sexual intercourse. Cervix is closed. Reviewed OB ultrasounds and placenta without abnormal implantation so no concern for previa. Abdominal pain has been chronic and unchanged in nature and given lack of significant bleeding very low suspicion for abruption. Noted to have yeast infection on wet prep. Will treat with 3 day course of Terconazole vaginally. Cultures pending. Initial BP slightly elevated at 141/77, improved to 131/74 on repeat. Patient denies history of elevated BP during pregnancy and is asymptomatic. Will need continued monitoring at future prenatal care visits.   [name] 12/11/2018, 8:42 PM

## 2018-12-11 NOTE — Discharge Instructions (Signed)
Your bleeding may be from the recent sexual intercourse. No bleeding or abnormalities were noted on your exam. Your cervix was closed on exam. Please return if you abdominal pain worsens or if you have increased bleeding.    You were found to have a yeast infection on your vaginal swabs. A anti-fungal medication has been sent to your pharmacy. Insert this medication into the vagina nightly for 3 nights.   Vaginal Yeast infection, Adult  Vaginal yeast infection is a condition that causes vaginal discharge as well as soreness, swelling, and redness (inflammation) of the vagina. This is a common condition. Some women get this infection frequently. What are the causes? This condition is caused by a change in the normal balance of the yeast (candida) and bacteria that live in the vagina. This change causes an overgrowth of yeast, which causes the inflammation. What increases the risk? The condition is more likely to develop in women who:  Take antibiotic medicines.  Have diabetes.  Take birth control pills.  Are pregnant.  Douche often.  Have a weak body defense system (immune system).  Have been taking steroid medicines for a long time.  Frequently wear tight clothing. What are the signs or symptoms? Symptoms of this condition include:  White, thick, creamy vaginal discharge.  Swelling, itching, redness, and irritation of the vagina. The lips of the vagina (vulva) may be affected as well.  Pain or a burning feeling while urinating.  Pain during sex. How is this diagnosed? This condition is diagnosed based on:  Your medical history.  A physical exam.  A pelvic exam. Your health care provider will examine a sample of your vaginal discharge under a microscope. Your health care provider may send this sample for testing to confirm the diagnosis. How is this treated? This condition is treated with medicine. Medicines may be over-the-counter or prescription. You may be told to use  one or more of the following:  Medicine that is taken by mouth (orally).  Medicine that is applied as a cream (topically).  Medicine that is inserted directly into the vagina (suppository). Follow these instructions at home:  Lifestyle  Do not have sex until your health care provider approves. Tell your sex partner that you have a yeast infection. That person should go to his or her health care provider and ask if they should also be treated.  Do not wear tight clothes, such as pantyhose or tight pants.  Wear breathable cotton underwear. General instructions  Take or apply over-the-counter and prescription medicines only as told by your health care provider.  Eat more yogurt. This may help to keep your yeast infection from returning.  Do not use tampons until your health care provider approves.  Try taking a sitz bath to help with discomfort. This is a warm water bath that is taken while you are sitting down. The water should only come up to your hips and should cover your buttocks. Do this 3-4 times per day or as told by your health care provider.  Do not douche.  If you have diabetes, keep your blood sugar levels under control.  Keep all follow-up visits as told by your health care provider. This is important. Contact a health care provider if:  You have a fever.  Your symptoms go away and then return.  Your symptoms do not get better with treatment.  Your symptoms get worse.  You have new symptoms.  You develop blisters in or around your vagina.  You have blood  coming from your vagina and it is not your menstrual period.  You develop pain in your abdomen. Summary  Vaginal yeast infection is a condition that causes discharge as well as soreness, swelling, and redness (inflammation) of the vagina.  This condition is treated with medicine. Medicines may be over-the-counter or prescription.  Take or apply over-the-counter and prescription medicines only as told by  your health care provider.  Do not douche. Do not have sex or use tampons until your health care provider approves.  Contact a health care provider if your symptoms do not get better with treatment or your symptoms go away and then return. This information is not intended to replace advice given to you by your health care provider. Make sure you discuss any questions you have with your health care provider. Document Released: 09/17/2005 Document Revised: 04/26/2018 Document Reviewed: 04/26/2018 Elsevier Interactive Patient Education  2019 [employer]

## 2018-12-13 LAB — GC/CHLAMYDIA PROBE AMP (~~LOC~~) NOT AT ARMC
Chlamydia: NEGATIVE
NEISSERIA GONORRHEA: NEGATIVE

## 2018-12-16 ENCOUNTER — Encounter: Payer: Self-pay | Admitting: Certified Nurse Midwife

## 2018-12-16 ENCOUNTER — Ambulatory Visit (INDEPENDENT_AMBULATORY_CARE_PROVIDER_SITE_OTHER): Payer: Medicaid Other | Admitting: Certified Nurse Midwife

## 2018-12-16 VITALS — BP 128/85 | HR 105 | Wt 204.0 lb

## 2018-12-16 DIAGNOSIS — Z3403 Encounter for supervision of normal first pregnancy, third trimester: Secondary | ICD-10-CM

## 2018-12-16 DIAGNOSIS — Z3400 Encounter for supervision of normal first pregnancy, unspecified trimester: Secondary | ICD-10-CM

## 2018-12-16 NOTE — Progress Notes (Signed)
Pt is here for ROB. [redacted]w[redacted]d.  

## 2018-12-16 NOTE — Progress Notes (Signed)
   PRENATAL VISIT NOTE  Subjective:  Alice Humphrey is a 22 y.o. G1P0 at 4980w6d being seen today for ongoing prenatal care.  She is currently monitored for the following issues for this low-risk pregnancy and has Encounter for supervision of normal pregnancy, unspecified, unspecified trimester on their problem list.  Patient reports no complaints.  Contractions: Not present. Vag. Bleeding: None.  Movement: Present. Denies leaking of fluid.   The following portions of the patient's history were reviewed and updated as appropriate: allergies, current medications, past family history, past medical history, past social history, past surgical history and problem list. Problem list updated.  Objective:   Vitals:   12/16/18 1557  BP: 128/85  Pulse: (!) 105  Weight: 204 lb (92.5 kg)    Fetal Status: Fetal Heart Rate (bpm): 145 Fundal Height: 36 cm Movement: Present     General:  Alert, oriented and cooperative. Patient is in no acute distress.  Skin: Skin is warm and dry. No rash noted.   Cardiovascular: Normal heart rate noted  Respiratory: Normal respiratory effort, no problems with respiration noted  Abdomen: Soft, gravid, appropriate for gestational age.  Pain/Pressure: Absent     Pelvic: Cervical exam deferred        Extremities: Normal range of motion.  Edema: None  Mental Status: Normal mood and affect. Normal behavior. Normal judgment and thought content.   Assessment and Plan:  Pregnancy: G1P0 at 5680w6d  1. Supervision of normal first pregnancy, antepartum - patient doing well, no complaints - anticipatory guidance on upcoming appointments with GBS screening at next appointment  - Discussed recommendations of antibiotics during labor with positive GBS  Preterm labor symptoms and general obstetric precautions including but not limited to vaginal bleeding, contractions, leaking of fluid and fetal movement were reviewed in detail with the patient. Please refer to After Visit  Summary for other counseling recommendations.  Return in about 2 weeks (around 12/30/2018) for ROB.  Future Appointments  Date Time Provider Department Center  12/30/2018  3:00 PM Sharyon Cableogers, Demesha Boorman C, CNM [facility] None    Sharyon CableVeronica C Emiel Kielty, [place]

## 2018-12-16 NOTE — Patient Instructions (Signed)

## 2018-12-22 NOTE — L&D Delivery Note (Addendum)
Delivery Note At 10:13 PM a viable female was delivered via Vaginal, Spontaneous (Presentation: vertex; ROA). APGAR: 8, 9; weight 7 lb 6.5 oz (3360 g).   Placenta status: delivered intact, spontaneously.  Cord: 3-vessel with the following complications: none.  Cord pH: N/A  Anesthesia: Epidural Episiotomy: None Lacerations: left medial Labial, superficial Suture Repair: None, hemostatic Est. Blood Loss (mL): 427  The patient was noted to be complete and pushing. Patient noted to have epidural anesthesia.   The patient was asked to push and the head delivered spontaneously in the ROA position, over an intact perineum. A nuchal cord was checked and none was noted.   The anterior shoulder delivered easily and the posterior shoulder followed. The remainder of the infant was easily delivered and placed on mom's chest skin-to-skin where nursing staff were in attendance. The infant was noted to have spontaneous cry and movement of all 4 extremities. The oropharynx and nasopharynx were bulb suctioned. After a 1-minute delay the cord was clamped x 2 and cut by father of the baby.   Cord blood was obtained.   The placenta delivered intact spontaneously. Pitocin was started IV to firm the uterus.   Examination of the cervix and vaginal vault revealed a 1st-degree 4cm laceration to the left anterior labia majora that was hemostatic and required no sutures. Examination of the perineum showed no lacerations.   All sponge and needle counts were correct. Dr. Elita Quick was present for the entire procedure.   Mom to postpartum.  Baby to Couplet care / Skin to Skin.  Alice Humphrey 01/19/2019, 11:53 PM  OB FELLOW DELIVERY ATTESTATION  I was gloved and present for the delivery in its entirety, and I agree with the above resident's note.    Alice Humphrey, D.O. OB Fellow  01/20/2019, 2:34 AM

## 2018-12-30 ENCOUNTER — Encounter: Payer: Self-pay | Admitting: Certified Nurse Midwife

## 2018-12-30 ENCOUNTER — Ambulatory Visit (INDEPENDENT_AMBULATORY_CARE_PROVIDER_SITE_OTHER): Payer: Medicaid Other | Admitting: Certified Nurse Midwife

## 2018-12-30 ENCOUNTER — Other Ambulatory Visit (HOSPITAL_COMMUNITY)
Admission: RE | Admit: 2018-12-30 | Discharge: 2018-12-30 | Disposition: A | Discharge status: Home / Self Care | Payer: Medicaid Other | Source: Ambulatory Visit | Attending: Certified Nurse Midwife | Admitting: Certified Nurse Midwife

## 2018-12-30 DIAGNOSIS — Z3400 Encounter for supervision of normal first pregnancy, unspecified trimester: Secondary | ICD-10-CM | POA: Diagnosis not present

## 2018-12-30 DIAGNOSIS — Z3403 Encounter for supervision of normal first pregnancy, third trimester: Secondary | ICD-10-CM

## 2018-12-30 DIAGNOSIS — Z3A36 36 weeks gestation of pregnancy: Secondary | ICD-10-CM

## 2018-12-30 LAB — OB RESULTS CONSOLE GC/CHLAMYDIA: Gonorrhea: NEGATIVE

## 2018-12-30 NOTE — Patient Instructions (Signed)
Reasons to go to MAU:  1.  Contractions are  3-4 minutes apart or less, each last 1 minute, these have been going on for 1-2 hours, and you cannot walk or talk during them 2.  You have a large gush of fluid, or a trickle of fluid that will not stop and you have to wear a pad 3.  You have bleeding that is bright red, heavier than spotting--like menstrual bleeding (spotting can be normal in early labor or after a check of your cervix) 4.  You do not feel the baby moving like he/she normally does  

## 2018-12-30 NOTE — Progress Notes (Signed)
   PRENATAL VISIT NOTE  Subjective:  Alice Humphrey is a 23 y.o. G1P0 at [redacted]w[redacted]d being seen today for ongoing prenatal care.  She is currently monitored for the following issues for this low-risk pregnancy and has Encounter for supervision of normal pregnancy, unspecified, unspecified trimester on their problem list.  Patient reports no complaints.  Contractions: Not present. Vag. Bleeding: None.  Movement: Present. Denies leaking of fluid.   The following portions of the patient's history were reviewed and updated as appropriate: allergies, current medications, past family history, past medical history, past social history, past surgical history and problem list. Problem list updated.  Objective:  Vitals:   12/30/18 1523  BP: 129/82  Pulse: (!) 116  Weight: 206 lb (93.4 kg)    Fetal Status: Fetal Heart Rate (bpm): 152 Fundal Height: 37 cm Movement: Present  Presentation: Vertex  General:  Alert, oriented and cooperative. Patient is in no acute distress.  Skin: Skin is warm and dry. No rash noted.   Cardiovascular: Normal heart rate noted  Respiratory: Normal respiratory effort, no problems with respiration noted  Abdomen: Soft, gravid, appropriate for gestational age.  Pain/Pressure: Present     Pelvic: Cervical exam performed Dilation: 1 Effacement (%): 30 Station: -2  Extremities: Normal range of motion.  Edema: Trace  Mental Status: Normal mood and affect. Normal behavior. Normal judgment and thought content.   Assessment and Plan:  Pregnancy: G1P0 at [redacted]w[redacted]d  1. Supervision of normal first pregnancy, antepartum - Patient doing well, no complaints - Anticipatory guidance on upcoming appointments - Labor precautions and reasons to be evaluated in MAU discussed  - Educated and discussed Braxton Hicks contractions vs labor contractions and what to expect, answered patient's questions  - Denies vaginal bleeding or contractions  - Strep Gp B NAA - Cervicovaginal ancillary only( [facility])  Term labor symptoms and general obstetric precautions including but not limited to vaginal bleeding, contractions, leaking of fluid and fetal movement were reviewed in detail with the patient. Please refer to After Visit Summary for other counseling recommendations.  Return in about 1 week (around 01/06/2019) for ROB.  Future Appointments  Date Time Provider Department Center  01/05/2019  4:15 PM Calvert Cantor, CNM [facility] None    Sharyon Cable, [place]

## 2018-12-31 LAB — CERVICOVAGINAL ANCILLARY ONLY
Bacterial vaginitis: NEGATIVE
Candida vaginitis: NEGATIVE
Chlamydia: NEGATIVE
Neisseria Gonorrhea: NEGATIVE
Trichomonas: NEGATIVE

## 2019-01-01 LAB — STREP GP B NAA: Strep Gp B NAA: POSITIVE — AB

## 2019-01-03 ENCOUNTER — Other Ambulatory Visit: Payer: Self-pay | Admitting: Obstetrics & Gynecology

## 2019-01-05 ENCOUNTER — Ambulatory Visit (INDEPENDENT_AMBULATORY_CARE_PROVIDER_SITE_OTHER): Payer: Medicaid Other | Admitting: Advanced Practice Midwife

## 2019-01-05 VITALS — BP 108/73 | HR 96 | Wt 203.7 lb

## 2019-01-05 DIAGNOSIS — Z3400 Encounter for supervision of normal first pregnancy, unspecified trimester: Secondary | ICD-10-CM

## 2019-01-05 NOTE — Progress Notes (Signed)
Patient reports fetal movement with occasional pressure and irritability.

## 2019-01-05 NOTE — Patient Instructions (Signed)

## 2019-01-05 NOTE — Progress Notes (Signed)
   PRENATAL VISIT NOTE  Subjective:  Alice Humphrey is a 23 y.o. G1P0 at [redacted]w[redacted]d being seen today for ongoing prenatal care.  She is currently monitored for the following issues for this low-risk pregnancy and has Encounter for supervision of normal pregnancy, unspecified, unspecified trimester on their problem list.  Patient reports no complaints.  Contractions: Irritability. Vag. Bleeding: None.  Movement: Present. Denies leaking of fluid.   The following portions of the patient's history were reviewed and updated as appropriate: allergies, current medications, past family history, past medical history, past social history, past surgical history and problem list. Problem list updated.  Objective:   Vitals:   01/05/19 1622  Weight: 203 lb 11.2 oz (92.4 kg)    Fetal Status: Fetal Heart Rate (bpm): 145 Fundal Height: 38 cm Movement: Present     General:  Alert, oriented and cooperative. Patient is in no acute distress.  Skin: Skin is warm and dry. No rash noted.   Cardiovascular: Normal heart rate noted  Respiratory: Normal respiratory effort, no problems with respiration noted  Abdomen: Soft, gravid, appropriate for gestational age.  Pain/Pressure: Present     Pelvic: Cervical exam deferred        Extremities: Normal range of motion.  Edema: Trace  Mental Status: Normal mood and affect. Normal behavior. Normal judgment and thought content.   Assessment and Plan:  Pregnancy: G1P0 at 108w5d  1. Supervision of normal first pregnancy, antepartum --No complaints or concerns --Given anticipatory guidance about labor and vaginal delivery --GBS positive (PCN prophylaxis in labor)  Term labor symptoms and general obstetric precautions including but not limited to vaginal bleeding, contractions, leaking of fluid and fetal movement were reviewed in detail with the patient. Please refer to After Visit Summary for other counseling recommendations.  Return in about 1 week (around  01/12/2019).  Future Appointments  Date Time Provider Department Center  01/12/2019  4:00 PM Adam Phenix, MD [facility] None    Calvert Cantor, [place]

## 2019-01-07 ENCOUNTER — Other Ambulatory Visit: Payer: Self-pay | Admitting: Obstetrics and Gynecology

## 2019-01-12 ENCOUNTER — Ambulatory Visit (INDEPENDENT_AMBULATORY_CARE_PROVIDER_SITE_OTHER): Payer: Medicaid Other | Admitting: Obstetrics & Gynecology

## 2019-01-12 VITALS — BP 119/80 | HR 101 | Wt 204.0 lb

## 2019-01-12 DIAGNOSIS — Z3403 Encounter for supervision of normal first pregnancy, third trimester: Secondary | ICD-10-CM

## 2019-01-12 DIAGNOSIS — Z3400 Encounter for supervision of normal first pregnancy, unspecified trimester: Secondary | ICD-10-CM

## 2019-01-12 NOTE — Progress Notes (Signed)
Patient wants to be checked 

## 2019-01-12 NOTE — Progress Notes (Signed)
   PRENATAL VISIT NOTE  Subjective:  Alice Humphrey is a 23 y.o. G1P0 at [redacted]w[redacted]d being seen today for ongoing prenatal care.  She is currently monitored for the following issues for this low-risk pregnancy and has Encounter for supervision of normal pregnancy, unspecified, unspecified trimester on their problem list.  Patient reports occasional contractions.  Contractions: Not present. Vag. Bleeding: None.  Movement: Present. Denies leaking of fluid.   The following portions of the patient's history were reviewed and updated as appropriate: allergies, current medications, past family history, past medical history, past social history, past surgical history and problem list. Problem list updated.  Objective:   Vitals:   01/12/19 1603  BP: 119/80  Pulse: (!) 101  Weight: 204 lb (92.5 kg)    Fetal Status: Fetal Heart Rate (bpm): 154 Fundal Height: 38 cm Movement: Present  Presentation: Vertex  General:  Alert, oriented and cooperative. Patient is in no acute distress.  Skin: Skin is warm and dry. No rash noted.   Cardiovascular: Normal heart rate noted  Respiratory: Normal respiratory effort, no problems with respiration noted  Abdomen: Soft, gravid, appropriate for gestational age.  Pain/Pressure: Present     Pelvic: Cervical exam performed Dilation: 1 Effacement (%): 30 Station: -3  Extremities: Normal range of motion.  Edema: Trace  Mental Status: Normal mood and affect. Normal behavior. Normal judgment and thought content.   Assessment and Plan:  Pregnancy: G1P0 at [redacted]w[redacted]d  1. Supervision of normal first pregnancy, antepartum Routine care third trimester  Term labor symptoms and general obstetric precautions including but not limited to vaginal bleeding, contractions, leaking of fluid and fetal movement were reviewed in detail with the patient. Please refer to After Visit Summary for other counseling recommendations.  Return in about 1 week (around 01/19/2019).  No future  appointments.  Scheryl Darter, MD

## 2019-01-12 NOTE — Patient Instructions (Signed)

## 2019-01-14 ENCOUNTER — Encounter (HOSPITAL_COMMUNITY): Payer: Self-pay

## 2019-01-14 ENCOUNTER — Inpatient Hospital Stay (HOSPITAL_COMMUNITY)
Admission: AD | Admit: 2019-01-14 | Discharge: 2019-01-14 | Disposition: A | Discharge status: Home / Self Care | Payer: Medicaid Other | Attending: Obstetrics & Gynecology | Admitting: Obstetrics & Gynecology

## 2019-01-14 DIAGNOSIS — Z3A39 39 weeks gestation of pregnancy: Secondary | ICD-10-CM | POA: Insufficient documentation

## 2019-01-14 DIAGNOSIS — Z3400 Encounter for supervision of normal first pregnancy, unspecified trimester: Secondary | ICD-10-CM

## 2019-01-14 DIAGNOSIS — Z3493 Encounter for supervision of normal pregnancy, unspecified, third trimester: Secondary | ICD-10-CM | POA: Insufficient documentation

## 2019-01-14 NOTE — MAU Note (Signed)
CTX every few minutes-unsure how far apart.  No LOF/VB.  + FM.  Was 1 cm on last exam-GBS +

## 2019-01-14 NOTE — MAU Note (Signed)
I have communicated with Dr. Evonnie Dawes and reviewed vital signs:  Vitals:   01/14/19 0041 01/14/19 0231  BP: 125/72 124/65  Pulse: 93 94  Resp: 19   Temp: 98.4 F (36.9 C)   SpO2: 99%     Vaginal exam:  Dilation: 1 Effacement (%): 70 Station: -2 Presentation: Vertex Exam by:: Latricia Heft, RN ,   Also reviewed contraction pattern and that non-stress test is reactive.  It has been documented that patient is contracting every 3-7 minutes with no cervical change over 1.5 hours not indicating active labor.  Patient denies any other complaints.  Based on this report provider has given order for discharge.  A discharge order and diagnosis entered by a provider.   Labor discharge instructions reviewed with patient.

## 2019-01-18 ENCOUNTER — Inpatient Hospital Stay (EMERGENCY_DEPARTMENT_HOSPITAL)
Admission: AD | Admit: 2019-01-18 | Discharge: 2019-01-19 | Disposition: A | Discharge status: Home / Self Care | Payer: Medicaid Other | Source: Home / Self Care | Attending: Family Medicine | Admitting: Family Medicine

## 2019-01-18 ENCOUNTER — Encounter (HOSPITAL_COMMUNITY): Payer: Self-pay

## 2019-01-18 DIAGNOSIS — O479 False labor, unspecified: Secondary | ICD-10-CM

## 2019-01-18 DIAGNOSIS — O471 False labor at or after 37 completed weeks of gestation: Secondary | ICD-10-CM

## 2019-01-18 DIAGNOSIS — Z3A39 39 weeks gestation of pregnancy: Secondary | ICD-10-CM | POA: Insufficient documentation

## 2019-01-18 NOTE — MAU Note (Signed)
Pt here with c/o contractions since about 2100 about 2 minutes apart. Denies any bleeding or leaking. Baby moving well. Was 1cm on last exam.

## 2019-01-19 ENCOUNTER — Other Ambulatory Visit: Payer: Self-pay

## 2019-01-19 ENCOUNTER — Inpatient Hospital Stay (HOSPITAL_COMMUNITY): Payer: Medicaid Other | Admitting: Anesthesiology

## 2019-01-19 ENCOUNTER — Encounter (HOSPITAL_COMMUNITY): Payer: Self-pay | Admitting: *Deleted

## 2019-01-19 ENCOUNTER — Inpatient Hospital Stay (HOSPITAL_COMMUNITY)
Admission: AD | Admit: 2019-01-19 | Discharge: 2019-01-21 | DRG: 807 | Disposition: A | Discharge status: Home / Self Care | Payer: Medicaid Other | Attending: Obstetrics & Gynecology | Admitting: Obstetrics & Gynecology

## 2019-01-19 DIAGNOSIS — F1290 Cannabis use, unspecified, uncomplicated: Secondary | ICD-10-CM | POA: Diagnosis present

## 2019-01-19 DIAGNOSIS — A491 Streptococcal infection, unspecified site: Secondary | ICD-10-CM | POA: Diagnosis present

## 2019-01-19 DIAGNOSIS — D649 Anemia, unspecified: Secondary | ICD-10-CM | POA: Diagnosis present

## 2019-01-19 DIAGNOSIS — O99824 Streptococcus B carrier state complicating childbirth: Principal | ICD-10-CM | POA: Diagnosis present

## 2019-01-19 DIAGNOSIS — O99334 Smoking (tobacco) complicating childbirth: Secondary | ICD-10-CM | POA: Diagnosis present

## 2019-01-19 DIAGNOSIS — O9902 Anemia complicating childbirth: Secondary | ICD-10-CM | POA: Diagnosis present

## 2019-01-19 DIAGNOSIS — Z3483 Encounter for supervision of other normal pregnancy, third trimester: Secondary | ICD-10-CM | POA: Diagnosis present

## 2019-01-19 DIAGNOSIS — Z3A39 39 weeks gestation of pregnancy: Secondary | ICD-10-CM

## 2019-01-19 DIAGNOSIS — O479 False labor, unspecified: Secondary | ICD-10-CM | POA: Diagnosis not present

## 2019-01-19 DIAGNOSIS — O99324 Drug use complicating childbirth: Secondary | ICD-10-CM | POA: Diagnosis present

## 2019-01-19 DIAGNOSIS — O99214 Obesity complicating childbirth: Secondary | ICD-10-CM | POA: Diagnosis present

## 2019-01-19 HISTORY — DX: Other specified health status: Z78.9

## 2019-01-19 LAB — CBC
HCT: 35.5 % — ABNORMAL LOW (ref 36.0–46.0)
Hemoglobin: 11.8 g/dL — ABNORMAL LOW (ref 12.0–15.0)
MCH: 29.6 pg (ref 26.0–34.0)
MCHC: 33.2 g/dL (ref 30.0–36.0)
MCV: 89 fL (ref 80.0–100.0)
Platelets: 262 10*3/uL (ref 150–400)
RBC: 3.99 MIL/uL (ref 3.87–5.11)
RDW: 15.6 % — AB (ref 11.5–15.5)
WBC: 14.9 10*3/uL — ABNORMAL HIGH (ref 4.0–10.5)
nRBC: 0 % (ref 0.0–0.2)

## 2019-01-19 LAB — TYPE AND SCREEN
ABO/RH(D): O POS
Antibody Screen: NEGATIVE

## 2019-01-19 LAB — RPR: RPR Ser Ql: NONREACTIVE

## 2019-01-19 LAB — ABO/RH: ABO/RH(D): O POS

## 2019-01-19 MED ORDER — EPHEDRINE 5 MG/ML INJ: 10.0000 mg | INTRAVENOUS | Status: DC | PRN

## 2019-01-19 MED ORDER — LACTATED RINGERS IV SOLN
500.0000 mL | Freq: Once | INTRAVENOUS | Status: AC
  Administered 2019-01-19: 500 mL via INTRAVENOUS

## 2019-01-19 MED ORDER — TERBUTALINE SULFATE 1 MG/ML IJ SOLN: 0.2500 mg | Freq: Once | INTRAMUSCULAR | Status: DC | PRN

## 2019-01-19 MED ORDER — ONDANSETRON HCL 4 MG/2ML IJ SOLN
4.0000 mg | Freq: Four times a day (QID) | INTRAMUSCULAR | Status: DC | PRN
  Administered 2019-01-19: 4 mg via INTRAVENOUS
  Filled 2019-01-19: qty 2

## 2019-01-19 MED ORDER — OXYTOCIN 40 UNITS IN NORMAL SALINE INFUSION - SIMPLE MED: 2.5000 [IU]/h | INTRAVENOUS | Status: DC

## 2019-01-19 MED ORDER — SOD CITRATE-CITRIC ACID 500-334 MG/5ML PO SOLN: 30.0000 mL | ORAL | Status: DC | PRN

## 2019-01-19 MED ORDER — SODIUM CHLORIDE 0.9 % IV SOLN
5.0000 10*6.[IU] | Freq: Once | INTRAVENOUS | Status: AC
  Administered 2019-01-19: 5 10*6.[IU] via INTRAVENOUS
  Filled 2019-01-19: qty 5

## 2019-01-19 MED ORDER — LIDOCAINE HCL (PF) 1 % IJ SOLN
30.0000 mL | INTRAMUSCULAR | Status: DC | PRN
  Filled 2019-01-19: qty 30

## 2019-01-19 MED ORDER — FENTANYL CITRATE (PF) 100 MCG/2ML IJ SOLN
50.0000 ug | INTRAMUSCULAR | Status: DC | PRN
  Administered 2019-01-19: 100 ug via INTRAVENOUS
  Filled 2019-01-19: qty 2

## 2019-01-19 MED ORDER — OXYCODONE-ACETAMINOPHEN 5-325 MG PO TABS: 2.0000 | ORAL_TABLET | ORAL | Status: DC | PRN

## 2019-01-19 MED ORDER — PHENYLEPHRINE 40 MCG/ML (10ML) SYRINGE FOR IV PUSH (FOR BLOOD PRESSURE SUPPORT)
80.0000 ug | PREFILLED_SYRINGE | INTRAVENOUS | Status: DC | PRN
  Filled 2019-01-19: qty 10

## 2019-01-19 MED ORDER — FENTANYL 2.5 MCG/ML BUPIVACAINE 1/10 % EPIDURAL INFUSION (WH - ANES)
14.0000 mL/h | INTRAMUSCULAR | Status: DC | PRN
  Administered 2019-01-19 (×2): 14 mL/h via EPIDURAL
  Filled 2019-01-19 (×3): qty 100

## 2019-01-19 MED ORDER — LACTATED RINGERS IV SOLN: 500.0000 mL | INTRAVENOUS | Status: DC | PRN

## 2019-01-19 MED ORDER — PENICILLIN G 3 MILLION UNITS IVPB - SIMPLE MED
3.0000 10*6.[IU] | INTRAVENOUS | Status: DC
  Administered 2019-01-19 (×2): 3 10*6.[IU] via INTRAVENOUS

## 2019-01-19 MED ORDER — LACTATED RINGERS IV SOLN
INTRAVENOUS | Status: DC
  Administered 2019-01-19 (×3): via INTRAVENOUS

## 2019-01-19 MED ORDER — FLEET ENEMA 7-19 GM/118ML RE ENEM: 1.0000 | ENEMA | RECTAL | Status: DC | PRN

## 2019-01-19 MED ORDER — ACETAMINOPHEN 325 MG PO TABS
650.0000 mg | ORAL_TABLET | ORAL | Status: DC | PRN
  Administered 2019-01-19: 650 mg via ORAL
  Filled 2019-01-19: qty 2

## 2019-01-19 MED ORDER — OXYTOCIN BOLUS FROM INFUSION
500.0000 mL | Freq: Once | INTRAVENOUS | Status: AC
  Administered 2019-01-19: 500 mL via INTRAVENOUS

## 2019-01-19 MED ORDER — OXYCODONE-ACETAMINOPHEN 5-325 MG PO TABS: 1.0000 | ORAL_TABLET | ORAL | Status: DC | PRN

## 2019-01-19 MED ORDER — DIPHENHYDRAMINE HCL 50 MG/ML IJ SOLN: 12.5000 mg | INTRAMUSCULAR | Status: DC | PRN

## 2019-01-19 MED ORDER — HYDROXYZINE HCL 50 MG PO TABS: 50.0000 mg | ORAL_TABLET | Freq: Four times a day (QID) | ORAL | Status: DC | PRN

## 2019-01-19 MED ORDER — OXYTOCIN 40 UNITS IN NORMAL SALINE INFUSION - SIMPLE MED
1.0000 m[IU]/min | INTRAVENOUS | Status: DC
  Administered 2019-01-19: 2 m[IU]/min via INTRAVENOUS
  Filled 2019-01-19: qty 1000

## 2019-01-19 MED ORDER — LIDOCAINE HCL (PF) 1 % IJ SOLN
INTRAMUSCULAR | Status: DC | PRN
  Administered 2019-01-19: 7 mL via EPIDURAL
  Administered 2019-01-19: 6 mL via EPIDURAL

## 2019-01-19 NOTE — Progress Notes (Cosign Needed)
[facility]N Faculty [facility]: Labor Progress Note  Subjective:   Comfortable with epidural.   Objective: BP 127/76   Pulse 97   Temp 97.6 F (36.4 C) (Oral)   Resp 16   Ht 4\' 11"  (1.499 m)   Wt 94.8 kg   LMP 04/16/2018   SpO2 100%   BMI 42.21 kg/m  Gen: alert and cooperative  Dilation: 6.5 Effacement (%): 90 Station: -2 Presentation: Vertex Exam by:: Kris Hartmann  Assessment and Plan: Alice Humphrey is a 23 y.o. G1P0 female at [redacted]w[redacted]d by LMP c/w 19wk u/s, presenting in labor.  Labor: active -- pain control: epidural --further dilation with bulging bag on exam. Head well applied. AROM with IUPC insertion.   Fetal Well-Being:  -- Category 1  -- GBS +   Rollene Rotunda, DO PGYI Family Medicine  3:02 PM

## 2019-01-19 NOTE — Progress Notes (Signed)
Patient ID: Alice Humphrey, female   DOB: 07/31/1996, 23 y.o.   MRN: [id number] Alice Humphrey is a 23 y.o. G1P0 at [redacted]w[redacted]d admitted for active labor  Subjective: Comfortable w/ epidural, no complaints  Objective: BP 123/65   Pulse 97   Temp 97.6 F (36.4 C) (Oral)   Resp 16   Ht 4\' 11"  (1.499 m)   Wt 94.8 kg   LMP 04/16/2018   SpO2 100%   BMI 42.21 kg/m  No intake/output data recorded.  FHT:  FHR: 125 bpm, variability: minimal ,  accelerations:  Present,  decelerations:  Absent UC:   q 2-58mins  SVE:   Dilation: 4.5 Effacement (%): 80 Station: -2 Exam by:: Marius Ditch, RN  Labs: Lab Results  Component Value Date   WBC 14.9 (H) 01/19/2019   HGB 11.8 (L) 01/19/2019   HCT 35.5 (L) 01/19/2019   MCV 89.0 01/19/2019   PLT 262 01/19/2019    Assessment / Plan: Spontaneous labor, progressing normally  Labor: Progressing normally Fetal Wellbeing:  Category II Pain Control:  Epidural Pre-eclampsia: n/a I/D:  PCN for GBS+ Anticipated MOD:  NSVD  Cheral Marker CNM, WHNP-BC 01/19/2019, 6:19 AM

## 2019-01-19 NOTE — Progress Notes (Signed)
[facility]N Faculty [facility]: Labor Progress Note  Subjective:   Comfortable with epidural.   Objective: BP 128/78   Pulse (!) 104   Temp 97.6 F (36.4 C) (Oral)   Resp 16   Ht 4\' 11"  (1.499 m)   Wt 94.8 kg   LMP 04/16/2018   SpO2 100%   BMI 42.21 kg/m  Gen: alert and cooperative  Dilation: 6.5 Effacement (%): 90 Station: -2 Presentation: Vertex Exam by:: Kris Hartmann  Assessment and Plan: Alice Humphrey is a 23 y.o. G1P0 female at [redacted]w[redacted]d by LMP c/w 19wk u/s, presenting in labor.  Labor: active -- pain control: epidural  Fetal Well-Being:  -- Category 1  -- GBS +   Rollene Rotunda, DO PGYI Family Medicine  11:49 AM

## 2019-01-19 NOTE — Progress Notes (Signed)
[facility]N Faculty [facility]: Labor Progress Note  Subjective:   Continues to remain comfortable. Mild progression   Objective: BP 126/74   Pulse (!) 105   Temp 97.6 F (36.4 C) (Oral)   Resp 16   Ht 4\' 11"  (1.499 m)   Wt 94.8 kg   LMP 04/16/2018   SpO2 100%   BMI 42.21 kg/m  Gen: alert and cooperative  Dilation: 8 Effacement (%): 90 Station: -2 Presentation: Vertex Exam by:: Will/Nicole Yetta Barre, RN  Assessment and Plan: Alice Humphrey is a 23 y.o. G1P0 female at [redacted]w[redacted]d by LMP c/w 19wk u/s, presenting in labor.  Labor: active -- pain control: epidural  Fetal Well-Being:  -- Category 1  -- GBS +  -- start pitocin   Rollene Rotunda, DO PGYI Family Medicine  6:18 PM

## 2019-01-19 NOTE — MAU Note (Signed)
Pt presents to MAU c/o ctx every 2 min apart that are 10/10 on the pain scale. No bleeding or LOF. +FM.

## 2019-01-19 NOTE — Progress Notes (Signed)
Pitocin pump was set at 4 milli-units/min and adjusted to 6 milli-units/min at 2024.

## 2019-01-19 NOTE — Anesthesia Preprocedure Evaluation (Signed)
Anesthesia Evaluation  Patient identified by MRN, date of birth, ID band Patient awake    Reviewed: Allergy & Precautions, H&P , Patient's Chart, lab work & pertinent test results  Airway Mallampati: III  TM Distance: >3 FB Neck ROM: full    Dental no notable dental hx. (+) Teeth Intact   Pulmonary neg pulmonary ROS,    Pulmonary exam normal breath sounds clear to auscultation       Cardiovascular negative cardio ROS Normal cardiovascular exam Rhythm:regular Rate:Normal     Neuro/Psych negative neurological ROS  negative psych ROS   GI/Hepatic negative GI ROS, Neg liver ROS,   Endo/Other  Morbid obesity  Renal/GU negative Renal ROS  negative genitourinary   Musculoskeletal   Abdominal (+) + obese,   Peds  Hematology  (+) Blood dyscrasia, anemia ,   Anesthesia Other Findings   Reproductive/Obstetrics (+) Pregnancy                             Anesthesia Physical Anesthesia Plan  ASA: III  Anesthesia Plan: Epidural   Post-op Pain Management:    Induction:   PONV Risk Score and Plan:   Airway Management Planned:   Additional Equipment:   Intra-op Plan:   Post-operative Plan:   Informed Consent: I have reviewed the patients History and Physical, chart, labs and discussed the procedure including the risks, benefits and alternatives for the proposed anesthesia with the patient or authorized representative who has indicated his/her understanding and acceptance.       Plan Discussed with:   Anesthesia Plan Comments:         Anesthesia Quick Evaluation

## 2019-01-19 NOTE — H&P (Signed)
Alice Humphrey is a 23 y.o. G1P0 female at [redacted]w[redacted]d by LMP c/w 19wk u/s, presenting in labor. Was 2cm earlier tonight in MAU, now returns is 4cm w/ BBOW and wanting epidural.    Reports active fetal movement, contractions: regular, vaginal bleeding: none, membranes: intact. Initiated prenatal care at [facility] at 10 wks.    This pregnancy complicated by: nothing  Prenatal History/Complications:  G1  Past Medical History: Past Medical History:  Diagnosis Date  . Pre-diabetes 2006    Past Surgical History: No past surgical history on file.  Obstetrical History: OB History    Gravida  1   Para      Term      Preterm      AB      Living        SAB      TAB      Ectopic      Multiple      Live Births              Social History: Social History   Socioeconomic History  . Marital status: Single    Spouse name: Not on file  . Number of children: Not on file  . Years of education: Not on file  . Highest education level: Not on file  Occupational History  . Not on file  Social Needs  . Financial resource strain: Not on file  . Food insecurity:    Worry: Not on file    Inability: Not on file  . Transportation needs:    Medical: Not on file    Non-medical: Not on file  Tobacco Use  . Smoking status: Current Some Day Smoker  . Smokeless tobacco: Never Used  Substance and Sexual Activity  . Alcohol use: Not Currently  . Drug use: Yes    Types: Marijuana    Comment: last smoked approximately 6 months ago (4/2019ish)  . Sexual activity: Yes    Partners: Male    Birth control/protection: None  Lifestyle  . Physical activity:    Days per week: Not on file    Minutes per session: Not on file  . Stress: Not on file  Relationships  . Social connections:    Talks on phone: Not on file    Gets together: Not on file    Attends religious service: Not on file    Active member of club or organization: Not on file    Attends meetings of clubs or organizations:  Not on file    Relationship status: Not on file  Other Topics Concern  . Not on file  Social History Narrative  . Not on file    Family History: Family History  Problem Relation Age of Onset  . AAA (abdominal aortic aneurysm) Mother   . Hypertension Maternal Grandmother   . Diabetes Maternal Grandmother   . Hypertension Maternal Grandfather   . Diabetes Maternal Grandfather   . COPD Maternal Grandfather     Allergies: No Known Allergies  Medications Prior to Admission  Medication Sig Dispense Refill Last Dose  . Doxylamine-Pyridoxine (DICLEGIS) 10-10 MG TBEC Take 2 tablets by mouth at bedtime. If symptoms persist, add one tablet in the morning and one in the afternoon (Patient not taking: Reported on 12/16/2018) 100 tablet 5 Not Taking  . Prenatal MV-Min-FA-Omega-3 (PRENATAL GUMMIES/DHA & FA) 0.4-32.5 MG CHEW Chew by mouth.   Taking  . Prenatal Vit-Fe Fumarate-FA (PRENATAL VITAMIN PLUS LOW IRON) 27-1 MG TABS TAKE 1 TABLET BY  MOUTH EVERY DAY 30 tablet 0 Taking  . Prenatal Vit-Fe Phos-FA-Omega (VITAFOL GUMMIES) 3.33-0.333-34.8 MG CHEW CHEW 3 GUMMIES BY MOUTH DAILY. 90 tablet 6     Review of Systems  Pertinent pos/neg as indicated in HPI  Last menstrual period 04/16/2018. General appearance: alert, cooperative and mild distress Lungs: clear to auscultation bilaterally Heart: regular rate and rhythm Abdomen: gravid, soft, non-tender Extremities: tr edema DTR's 2+  Fetal monitoring: FHR: 140 bpm, variability: moderate,  Accelerations: Present,  decelerations:  Absent Uterine activity: regular Dilation: 4 Effacement (%): 90 Station: -1 Exam by:: lauren cox rn  Presentation: cephalic   Prenatal labs: ABO, Rh: O/Positive/-- (07/11 1328) Antibody: Negative (07/11 1328) Rubella: 9.89 (07/11 1328) RPR: Non Reactive (11/14 1113)  HBsAg: Negative (07/11 1328)  HIV: Non Reactive (11/14 1113)  GBS: Positive (01/09 1557)   2hr GTT: 83/148/105 Genetic screening:  Normal  NIPS Anatomy US: normal  No results found for this or any previous visit (from the past 24 hour(s)).   Assessment:  [redacted]w[redacted]d SIUP  G1P0  Labor  Cat 1 FHR  GBS Positive (01/09 1557)  Plan:  Admit to BS  IV pain meds/epidural prn active labor  Expectant management  Anticipate NSVB   Plans to breastfeed  Contraception: undecided  Circumcision: no  Cheral Marker CNM, WHNP-BC 01/19/2019, 3:12 AM

## 2019-01-19 NOTE — Anesthesia Procedure Notes (Signed)
Epidural Patient location during procedure: OB Start time: 01/19/2019 4:32 AM End time: 01/19/2019 4:36 AM  Staffing Anesthesiologist: Leilani Able, MD  Preanesthetic Checklist Completed: patient identified, site marked, surgical consent, pre-op evaluation, timeout performed, IV checked, risks and benefits discussed and monitors and equipment checked  Epidural Patient position: sitting Prep: site prepped and draped and DuraPrep Patient monitoring: continuous pulse ox and blood pressure Approach: midline Location: L3-L4 Injection technique: LOR air  Needle:  Needle type: Tuohy  Needle gauge: 17 G Needle length: 9 cm and 9 Needle insertion depth: 7 cm Catheter type: closed end flexible Catheter size: 19 Gauge Catheter at skin depth: 12 cm Test dose: negative and Other  Assessment Sensory level: T9 Events: blood not aspirated, injection not painful, no injection resistance, negative IV test and no paresthesia  Additional Notes Reason for block:procedure for pain

## 2019-01-19 NOTE — MAU Note (Signed)
I have communicated with Joellyn Haff, CNM and reviewed vital signs:  Vitals:   01/18/19 2317  BP: 119/61  Pulse: 80  Resp: 18  Temp: 97.7 F (36.5 C)  SpO2: 100%    Vaginal exam:  Dilation: 2 Effacement (%): 70 Station: -2 Presentation: Vertex Exam by:: Latricia Heft, RN,   Also reviewed contraction pattern and that non-stress test is reactive.  It has been documented that patient is contracting every 1-4 minutes with minimal cervical change over 1.5 hours not indicating active labor.  Patient denies any other complaints.  Based on this report provider has given order for discharge.  A discharge order and diagnosis entered by a provider.   Labor discharge instructions reviewed with patient.

## 2019-01-19 NOTE — MAU Provider Note (Signed)
Alice Humphrey is a 23 y.o. G1P0 female at [redacted]w[redacted]d  RN Labor check, not seen by provider SVE by RN: Dilation: 2 Effacement (%): 70 Station: -2 Exam by:: Latricia Heft, RN NST: FHR baseline 135 bpm, Variability: moderate, Accelerations:present, Decelerations:  Absent= Cat 1/Reactive Toco: q 2-51mins  D/C home  Cheral Marker CNM, [facility] 01/19/2019 12:52 AM

## 2019-01-19 NOTE — Anesthesia Pain Management Evaluation Note (Signed)
  CRNA Pain Management Visit Note  Patient: Alice Humphrey, 23 y.o., female  "Hello I am a member of the anesthesia team at [facility]. We have an anesthesia team available at all times to provide care throughout the hospital, including epidural management and anesthesia for C-section. I don't know your plan for the delivery whether it a natural birth, water birth, IV sedation, nitrous supplementation, doula or epidural, but we want to meet your pain goals."   1.Was your pain managed to your expectations on prior hospitalizations?   No prior hospitalizations  2.What is your expectation for pain management during this hospitalization?     Epidural  3.How can we help you reach that goal? Epidural in place  Record the patient's initial score and the patient's pain goal.   Pain: 0  Pain Goal: 3 [facility] wants you to be able to say your pain was always managed very well.  Joselito Fieldhouse 01/19/2019

## 2019-01-20 ENCOUNTER — Encounter (HOSPITAL_COMMUNITY): Payer: Self-pay

## 2019-01-20 MED ORDER — ZOLPIDEM TARTRATE 5 MG PO TABS: 5.0000 mg | ORAL_TABLET | Freq: Every evening | ORAL | Status: DC | PRN

## 2019-01-20 MED ORDER — SENNOSIDES-DOCUSATE SODIUM 8.6-50 MG PO TABS
2.0000 | ORAL_TABLET | ORAL | Status: DC
  Administered 2019-01-20 (×2): 2 via ORAL
  Filled 2019-01-20 (×2): qty 2

## 2019-01-20 MED ORDER — TETANUS-DIPHTH-ACELL PERTUSSIS 5-2.5-18.5 LF-MCG/0.5 IM SUSP
0.5000 mL | Freq: Once | INTRAMUSCULAR | Status: DC
  Filled 2019-01-20: qty 0.5

## 2019-01-20 MED ORDER — MEASLES, MUMPS & RUBELLA VAC IJ SOLR
0.5000 mL | Freq: Once | INTRAMUSCULAR | Status: DC
  Filled 2019-01-20: qty 0.5

## 2019-01-20 MED ORDER — BENZOCAINE-MENTHOL 20-0.5 % EX AERO
1.0000 "application " | INHALATION_SPRAY | CUTANEOUS | Status: DC | PRN
  Administered 2019-01-20: 1 via TOPICAL
  Filled 2019-01-20 (×2): qty 56

## 2019-01-20 MED ORDER — WITCH HAZEL-GLYCERIN EX PADS: 1.0000 "application " | MEDICATED_PAD | CUTANEOUS | Status: DC | PRN

## 2019-01-20 MED ORDER — IBUPROFEN 600 MG PO TABS
600.0000 mg | ORAL_TABLET | Freq: Four times a day (QID) | ORAL | Status: DC
  Administered 2019-01-20 – 2019-01-21 (×6): 600 mg via ORAL
  Filled 2019-01-20 (×7): qty 1

## 2019-01-20 MED ORDER — DIBUCAINE 1 % RE OINT
1.0000 "application " | TOPICAL_OINTMENT | RECTAL | Status: DC | PRN
  Filled 2019-01-20: qty 28

## 2019-01-20 MED ORDER — PRENATAL MULTIVITAMIN CH
1.0000 | ORAL_TABLET | Freq: Every day | ORAL | Status: DC
  Administered 2019-01-20 – 2019-01-21 (×2): 1 via ORAL
  Filled 2019-01-20 (×2): qty 1

## 2019-01-20 MED ORDER — SIMETHICONE 80 MG PO CHEW: 80.0000 mg | CHEWABLE_TABLET | ORAL | Status: DC | PRN

## 2019-01-20 MED ORDER — ONDANSETRON HCL 4 MG PO TABS
4.0000 mg | ORAL_TABLET | ORAL | Status: DC | PRN
  Filled 2019-01-20: qty 1

## 2019-01-20 MED ORDER — COCONUT OIL OIL
1.0000 "application " | TOPICAL_OIL | Status: DC | PRN
  Administered 2019-01-21: 1 via TOPICAL
  Filled 2019-01-20 (×2): qty 120

## 2019-01-20 MED ORDER — OXYCODONE HCL 5 MG PO TABS
5.0000 mg | ORAL_TABLET | Freq: Once | ORAL | Status: AC
  Administered 2019-01-20: 5 mg via ORAL
  Filled 2019-01-20: qty 1

## 2019-01-20 MED ORDER — DIPHENHYDRAMINE HCL 25 MG PO CAPS: 25.0000 mg | ORAL_CAPSULE | Freq: Four times a day (QID) | ORAL | Status: DC | PRN

## 2019-01-20 MED ORDER — ACETAMINOPHEN 325 MG PO TABS
650.0000 mg | ORAL_TABLET | ORAL | Status: DC | PRN
  Administered 2019-01-20 – 2019-01-21 (×3): 650 mg via ORAL
  Filled 2019-01-20 (×3): qty 2

## 2019-01-20 MED ORDER — ONDANSETRON HCL 4 MG/2ML IJ SOLN: 4.0000 mg | INTRAMUSCULAR | Status: DC | PRN

## 2019-01-20 NOTE — Lactation Note (Signed)
This note was copied from a baby's chart. Lactation Consultation Note  Patient Name: Alice Humphrey YSAYT'K Date: 01/20/2019 Reason for consult: Initial assessment;Term P1, 5 hour female infant. Per mom, infant had one stool since delivery. Per mom, she is active in [facility] program in [facility] and attended breastfeeding classes in her pregnancy. Mom demonstrated hand expression and infant was given 4 ml of colostrum by spoon. LC notice mom has flat nipples, breast shells were given and explained how to use.  LC dicussed with mom, she can lightly hand express, do breast stimulation,  use hand pump to help elongate nipple shaft out more prior to latching infant to breast.  Mom latched infant on left breast using the football hold, infant latched with wide gape, tongue down and lower jaw extend, LC heard swallows. Infant breastfeed for 15 minutes.  Mom knows to breastfeed infant according hunger cues and not exceed 3 hours without breastfeeding infant.  LC discussed I & O. Reviewed Baby & Me book's Breastfeeding Basics.  Mom knows she can call Nurse  or LC if she has any questions, concerns or need further assistance with latching infant to breast. Mom made aware of O/P services, breastfeeding support groups, community resources, and our phone # for post-discharge questions.    Maternal Data Formula Feeding for Exclusion: No Has patient been taught Hand Expression?: Yes(Mom hand expressed 20ml of colostrum that was spoon feed to infant.) Does the patient have breastfeeding experience prior to this delivery?: No  Feeding Feeding Type: Breast Fed  LATCH Score Latch: Grasps breast easily, tongue down, lips flanged, rhythmical sucking.  Audible Swallowing: Spontaneous and intermittent  Type of Nipple: Flat  Comfort (Breast/Nipple): Soft / non-tender  Hold (Positioning): Assistance needed to correctly position infant at breast and maintain latch.  LATCH Score:  8  Interventions Interventions: Breast feeding basics reviewed;Assisted with latch;Skin to skin;Breast massage;Hand express;Support pillows;Adjust position;Shells;Breast compression;Expressed milk;Hand pump;Pre-pump if needed  Lactation Tools Discussed/Used Tools: Shells;Pump Shell Type: Other (comment)(flat) Breast pump type: Manual WIC Program: Yes Pump Review: Setup, frequency, and cleaning;Milk Storage Initiated by:: Danelle Earthly, IBCLC Date initiated:: 01/20/19   Consult Status Consult Status: Follow-up Date: 01/20/19 Follow-up type: In-patient    Danelle Earthly 01/20/2019, 3:36 AM

## 2019-01-20 NOTE — Progress Notes (Signed)
CSW received consult for hx of marijuana use.  Referral was screened out due to the following: ~MOB had no documented substance use after initial prenatal visit/+UPT. ~MOB had no positive drug screens after initial prenatal visit/+UPT.  Please consult CSW if current concerns arise or by MOB's request.  Infant's UDS is still pending, CSW will see MOB if UDS is positive.   CSW will monitor CDS results and make report to Child Protective Services if warranted.  Celso Sickle, LCSWA Clinical Social Worker [facility] Cell#: [phone]

## 2019-01-20 NOTE — Anesthesia Postprocedure Evaluation (Signed)
Anesthesia Post Note  Patient: Alice Humphrey  Procedure(s) Performed: AN AD HOC LABOR EPIDURAL     Patient location during evaluation: Mother Baby Anesthesia Type: Epidural Level of consciousness: awake Pain management: pain level controlled Vital Signs Assessment: post-procedure vital signs reviewed and stable Respiratory status: spontaneous breathing Cardiovascular status: stable Postop Assessment: patient able to bend at knees, epidural receding, no headache and no backache Anesthetic complications: no    Last Vitals:  Vitals:   01/20/19 0607 01/20/19 0836  BP: 120/68 109/73  Pulse: 89 85  Resp:  20  Temp: 36.5 C 36.7 C  SpO2:      Last Pain:  Vitals:   01/20/19 1220  TempSrc:   PainSc: 3    Pain Goal: Patients Stated Pain Goal: (pt states "pushed epidural button") (01/19/19 1600)                 Edison Pace

## 2019-01-20 NOTE — Progress Notes (Signed)
Post Partum Day 1 Subjective: up ad lib, voiding and tolerating PO  Objective: Blood pressure 109/73, pulse 85, temperature 98.1 F (36.7 C), temperature source Oral, resp. rate 20, height 4\' 11"  (1.499 m), weight 94.8 kg, last menstrual period 04/16/2018, SpO2 100 %, unknown if currently breastfeeding.  Physical Exam:  General: alert and cooperative Lochia: appropriate Uterine Fundus: firm Incision: n/a DVT Evaluation: No evidence of DVT seen on physical exam. Negative Homan's sign.  Recent Labs    01/19/19 0314  HGB 11.8*  HCT 35.5*    Assessment/Plan: Plan for discharge tomorrow  Continue current care    LOS: 1 day   Cristine Polio 01/20/2019, 9:13 AM

## 2019-01-21 MED ORDER — PRENATAL MULTIVITAMIN CH: 1.0000 | ORAL_TABLET | Freq: Every day | ORAL | 0 refills | Status: AC

## 2019-01-21 MED ORDER — IBUPROFEN 600 MG PO TABS: 600.0000 mg | ORAL_TABLET | Freq: Four times a day (QID) | ORAL | 0 refills | Status: DC

## 2019-01-21 NOTE — Lactation Note (Signed)
This note was copied from a baby's chart. Lactation Consultation Note  Patient Name: Alice Humphrey IDPOE'U Date: 01/21/2019   Baby 37 hours old.  Mother's L nipple has abrasion.  Encouraged her to apply ebm and coconut oil. Suggest she call for to help with depth and feeding. Feed on demand approximately 8-12 times per day.   Reviewed engorgement care and monitoring voids/stools.      Maternal Data    Feeding Feeding Type: Breast Fed  LATCH Score Latch: Grasps breast easily, tongue down, lips flanged, rhythmical sucking.  Audible Swallowing: Spontaneous and intermittent  Type of Nipple: Everted at rest and after stimulation  Comfort (Breast/Nipple): Filling, red/small blisters or bruises, mild/mod discomfort  Hold (Positioning): No assistance needed to correctly position infant at breast.  LATCH Score: 9  Interventions    Lactation Tools Discussed/Used     Consult Status      Dahlia Byes [facility] 01/21/2019, 12:02 PM

## 2019-01-22 ENCOUNTER — Ambulatory Visit: Payer: Self-pay

## 2019-01-22 NOTE — Lactation Note (Signed)
This note was copied from a baby's chart. Lactation Consultation Note:  Mother reports that she has been bottle feeding formula due to nipple pain.  Observed that mother does have a crack on the left nipple and rt nipple is very pink.  Suggested that mother phone OB for [facility]. Mother has comfort gels, shells and coconut oil   Reviewed hand expression and advised mother to apply ebm to nipples. Mother was able to get large flow of colostrum.  Mother reports that she is afraid to use hand pump due to nipple pain. Discussed importance of protecting milk supply and need to pump if using formula.   Mother reports that she has a DEBP at home but unsure what kind.  Discussed importance of using the correct flange size. Suggested that mother try using hand pump while at hospital to make sure it is a good fit.  Discussed treatment and prevention of engorgement.   Mother was given [facility] services phone # for questions and or breastfeeding concerns.   Mother to [facility] if desires assistance with latch or pumping.   Patient Name: Alice Humphrey OOILN'Z Date: 01/22/2019 Reason for consult: Follow-up assessment   Maternal Data    Feeding Feeding Type: Bottle Fed - Formula Nipple Type: Slow - flow  LATCH Score                   Interventions Interventions: Breast massage;Hand express;Pre-pump if needed;Expressed milk;Shells;Comfort gels;Hand pump  Lactation Tools Discussed/Used     Consult Status Consult Status: Complete    Michel Bickers 01/22/2019, 10:38 AM

## 2019-02-10 DIAGNOSIS — A491 Streptococcal infection, unspecified site: Secondary | ICD-10-CM | POA: Diagnosis present

## 2019-02-10 NOTE — Discharge Summary (Signed)
OB Discharge Summary     Patient Name: Alice Humphrey DOB: February 23, 1996 MRN: [id number]  Date of admission: 01/19/2019 Delivering MD: Dollene Cleveland   Date of discharge: 01/21/19  Admitting diagnosis: 39.5WKS CTX Intrauterine pregnancy: [redacted]w[redacted]d     Secondary diagnosis:  Active Problems:   Normal labor   Group B streptococcal infection  Additional problems: none     Discharge diagnosis: Term Pregnancy Delivered                                                                                                Post partum procedures:none  Augmentation: AROM and Pitocin  Complications: None  Hospital course:  Onset of Labor With Vaginal Delivery     23 y.o. yo G1P1001 at [redacted]w[redacted]d was admitted in Latent Labor on 01/19/2019. She was given PCN for GBS ppx and was augmented as noted above. Patient had an uncomplicated labor course as follows:  Membrane Rupture Time/Date: 2:55 PM ,01/19/2019   Intrapartum Procedures: Episiotomy: None [1]                                         Lacerations:  Labial [10]  Patient had a delivery of a Viable infant. 01/19/2019  Information for the patient's newborn:  [name], [name] [[id number]]  Delivery Method: Vag-Spont    Pateint had an uncomplicated postpartum course.  She is ambulating, tolerating a regular diet, passing flatus, and urinating well. Patient is discharged home in stable condition on 01/21/19.  Physical exam  Vitals:   01/20/19 0836 01/20/19 1430 01/20/19 2300 01/21/19 0543  BP: 109/73 106/72 120/66 126/72  Pulse: 85 83 83 75  Resp: 20 18 18 16   Temp: 98.1 F (36.7 C) 98.1 F (36.7 C)  97.6 F (36.4 C)  TempSrc: Oral Oral  Tympanic  SpO2:      Weight:      Height:       General: alert and cooperative Lochia: appropriate Uterine Fundus: firm Incision: N/A DVT Evaluation: No evidence of DVT seen on physical exam. Labs: Lab Results  Component Value Date   WBC 14.9 (H) 01/19/2019   HGB 11.8 (L) 01/19/2019   HCT 35.5 (L)  01/19/2019   MCV 89.0 01/19/2019   PLT 262 01/19/2019   CMP Latest Ref Rng & Units 08/15/2015  Glucose 65 - 99 mg/dL 96  BUN 6 - 20 mg/dL <3(K)  Creatinine 0.67 - 1.00 mg/dL 7.03  Sodium 403 - 524 mmol/L 140  Potassium 3.5 - 5.1 mmol/L 4.4  Chloride 101 - 111 mmol/L 107  CO2 22 - 32 mmol/L 28  Calcium 8.9 - 10.3 mg/dL 8.3(L)  Total Protein 6.5 - 8.1 g/dL 5.0(L)  Total Bilirubin 0.3 - 1.2 mg/dL 0.4  Alkaline Phos 38 - 126 U/L 49  AST 15 - 41 U/L 16  ALT 14 - 54 U/L 18    Discharge instruction: per After Visit Summary and "Baby and Me Booklet".  After visit meds:  Allergies as of 01/21/2019  No Known Allergies     Medication List    STOP taking these medications   Doxylamine-Pyridoxine 10-10 MG Tbec Commonly known as:  DICLEGIS   VITAFOL GUMMIES 3.33-0.333-34.8 MG Chew     TAKE these medications   ibuprofen 600 MG tablet Commonly known as:  ADVIL,MOTRIN Take 1 tablet (600 mg total) by mouth every 6 (six) hours.   prenatal multivitamin Tabs tablet Take 1 tablet by mouth daily at 12 noon for 30 days. What changed:    medication strength  when to take this       Diet: routine diet  Activity: Advance as tolerated. Pelvic rest for 6 weeks.   Outpatient follow up:4 weeks Follow up Appt: Future Appointments  Date Time Provider Department Center  02/16/2019 10:30 AM Burleson, Brand Males, NP [facility] None   Follow up Visit:No follow-ups on file.  Postpartum contraception: Undecided  Newborn Data: Live born female  Birth Weight: 7 lb 6.5 oz (3360 g) APGAR: 8, 9  Newborn Delivery   Birth date/time:  01/19/2019 22:13:00 Delivery type:  Vaginal, Spontaneous     Baby Feeding: Breast Disposition:home with mother   02/10/2019 [name], CNM

## 2019-02-15 ENCOUNTER — Telehealth: Payer: Self-pay

## 2019-02-15 NOTE — Telephone Encounter (Signed)
  Patient notified FMLA papers are ready and of $15 cost.

## 2019-02-15 NOTE — Telephone Encounter (Signed)
Left VM message to call office. Need info to complete FMLA  forms.

## 2019-02-15 NOTE — Progress Notes (Signed)
Post Partum Exam  Alice Humphrey is a 23 y.o. G51P1001 female who presents for a postpartum visit. She is 4 weeks postpartum following a spontaneous vaginal delivery. I have fully reviewed the prenatal and intrapartum course. The delivery was at 39 gestational weeks.  Anesthesia: epidural. Postpartum course has been uneventful. Baby's course has been . Baby is feeding by bottle - Enfamil Gentlease. Bleeding no bleeding. Bowel function is normal. Bladder function is normal. Patient is sexually active. Contraception method is none. Postpartum depression screening:neg  The following portions of the patient's history were reviewed and updated as appropriate: allergies, current medications, past family history, past medical history, past social history, past surgical history and problem Humphrey. Last pap smear done 07-01-18 and was Normal  Review of Systems Pertinent items noted in HPI and remainder of comprehensive ROS otherwise negative.    Objective:  Last menstrual period 04/16/2018, unknown if currently breastfeeding.  General:  alert, cooperative and no distress   Breasts:  not performed  Lungs: clear to auscultation bilaterally  Heart:  regular rate and rhythm, S1, S2 normal, no murmur, click, rub or gallop  Abdomen: not done   Vulva:  not evaluated  Vagina: not evaluated  Cervix:  not evaluated  Corpus: not examined  Adnexa:  not evaluated  Rectal Exam: Not performed.        Assessment:    Normal postpartum exam. Pap smear not done at today's visit.   Plan:   1. Contraception: OCP (estrogen/progesterone).  Start pills today.  Use condoms for 2 weeks.  Unable to completely assure you are not pregnant today, but will start pills on the assumption that you are not yet pregnant.  Pills will not harm the pregnancy if you are pregnant.  Today's urine pregnancy test is negative. 2. Weight loss advised to below a BMI of 25.   3. Follow up in: 3 months for BP check and pill refill or sooner as  needed.   Nolene Bernheim, RN, MSN, NP-BC Nurse Practitioner, [facility] [employer], [facility] 02/16/2019 1:03 PM

## 2019-02-16 ENCOUNTER — Encounter: Payer: Self-pay | Admitting: Nurse Practitioner

## 2019-02-16 ENCOUNTER — Other Ambulatory Visit: Payer: Self-pay

## 2019-02-16 ENCOUNTER — Ambulatory Visit (INDEPENDENT_AMBULATORY_CARE_PROVIDER_SITE_OTHER): Payer: Medicaid Other | Admitting: Nurse Practitioner

## 2019-02-16 DIAGNOSIS — Z3482 Encounter for supervision of other normal pregnancy, second trimester: Secondary | ICD-10-CM

## 2019-02-16 DIAGNOSIS — Z3202 Encounter for pregnancy test, result negative: Secondary | ICD-10-CM

## 2019-02-16 DIAGNOSIS — Z6838 Body mass index (BMI) 38.0-38.9, adult: Secondary | ICD-10-CM | POA: Insufficient documentation

## 2019-02-16 DIAGNOSIS — Z30011 Encounter for initial prescription of contraceptive pills: Secondary | ICD-10-CM

## 2019-02-16 DIAGNOSIS — Z1389 Encounter for screening for other disorder: Secondary | ICD-10-CM | POA: Diagnosis not present

## 2019-02-16 LAB — POCT URINE PREGNANCY: Preg Test, Ur: NEGATIVE

## 2019-02-16 MED ORDER — LEVONORGESTREL-ETHINYL ESTRAD 0.15-30 MG-MCG PO TABS: 1.0000 | ORAL_TABLET | Freq: Every day | ORAL | 2 refills | Status: DC

## 2019-05-04 ENCOUNTER — Other Ambulatory Visit: Payer: Self-pay | Admitting: Nurse Practitioner

## 2019-05-17 NOTE — Telephone Encounter (Signed)
Pt needs refill on BC pills.  Per last note, pt to have BP check for refills. Pt wants to know if can be approved for refills, pt is currently out of pills.  Pt made aware message to be sent to provider for recommendation.

## 2019-05-17 NOTE — Telephone Encounter (Signed)
Due to Covid 19, will defer BP check and renew pills for now.

## 2019-08-02 ENCOUNTER — Other Ambulatory Visit: Payer: Self-pay

## 2019-08-02 ENCOUNTER — Telehealth: Payer: Self-pay

## 2019-08-02 MED ORDER — LEVONORGESTREL-ETHINYL ESTRAD 0.15-30 MG-MCG PO TABS: 1.0000 | ORAL_TABLET | Freq: Every day | ORAL | 0 refills | Status: DC

## 2019-08-02 NOTE — Telephone Encounter (Signed)
Contacted pt to advise that provider is sending one [facility] refill and will need BP check appt, no answer, left vm

## 2019-08-25 ENCOUNTER — Other Ambulatory Visit: Payer: Self-pay | Admitting: Nurse Practitioner

## 2019-09-12 ENCOUNTER — Other Ambulatory Visit: Payer: Self-pay

## 2019-09-12 ENCOUNTER — Telehealth: Payer: Self-pay

## 2019-09-12 DIAGNOSIS — Z30011 Encounter for initial prescription of contraceptive pills: Secondary | ICD-10-CM

## 2019-09-12 MED ORDER — LEVONORGESTREL-ETHINYL ESTRAD 0.15-30 MG-MCG PO TABS: 1.0000 | ORAL_TABLET | Freq: Every day | ORAL | 1 refills | Status: DC

## 2019-09-12 NOTE — Telephone Encounter (Signed)
Pt stated to front desk she needs refill on BCP Refill sent today with 1 refill per notes pt needs appt for B/P check.

## 2019-11-06 ENCOUNTER — Other Ambulatory Visit: Payer: Self-pay | Admitting: Obstetrics

## 2019-11-06 DIAGNOSIS — Z30011 Encounter for initial prescription of contraceptive pills: Secondary | ICD-10-CM

## 2020-07-24 ENCOUNTER — Other Ambulatory Visit: Payer: Self-pay | Admitting: Obstetrics

## 2020-07-24 DIAGNOSIS — Z30011 Encounter for initial prescription of contraceptive pills: Secondary | ICD-10-CM

## 2021-03-24 ENCOUNTER — Other Ambulatory Visit: Payer: Self-pay

## 2021-03-24 ENCOUNTER — Ambulatory Visit (HOSPITAL_COMMUNITY)
Admission: EM | Admit: 2021-03-24 | Discharge: 2021-03-24 | Disposition: A | Discharge status: Home / Self Care | Payer: Medicaid Other | Attending: Student | Admitting: Student

## 2021-03-24 ENCOUNTER — Encounter (HOSPITAL_COMMUNITY): Payer: Self-pay | Admitting: *Deleted

## 2021-03-24 DIAGNOSIS — M62838 Other muscle spasm: Secondary | ICD-10-CM

## 2021-03-24 MED ORDER — KETOROLAC TROMETHAMINE 60 MG/2ML IM SOLN
INTRAMUSCULAR | Status: AC
  Filled 2021-03-24: qty 2

## 2021-03-24 MED ORDER — KETOROLAC TROMETHAMINE 60 MG/2ML IM SOLN
60.0000 mg | Freq: Once | INTRAMUSCULAR | Status: AC
  Administered 2021-03-24: 60 mg via INTRAMUSCULAR

## 2021-03-24 MED ORDER — TIZANIDINE HCL 2 MG PO CAPS: 2.0000 mg | ORAL_CAPSULE | Freq: Three times a day (TID) | ORAL | 0 refills | Status: DC

## 2021-03-24 NOTE — ED Triage Notes (Signed)
Pt reports upon waking this morning when turning her head Pt heard a pop . Pt has had Rt sided neck pain since then. Pt reports she has put ice to site with out relief of neck pain.

## 2021-03-24 NOTE — Discharge Instructions (Addendum)
-  Start the muscle relaxer-Zanaflex (tizanidine), up to 3 times daily for muscle spasms and pain.  This can make you drowsy, so take at bedtime or when you do not need to drive or operate machinery. -You can still take Tylenol tonight, but avoid other NSAIDs like ibuprofen since we gave you a shot of pain medication. -Starting tomorrow- for pain, you can Take Tylenol 1000 mg 3 times daily, and ibuprofen 800 mg 3 times daily with food.  You can take these together, or alternate every 3-4 hours. -Seek additional medical attention if your symptoms persist despite treatment, like worsening of pain, weakness or sensation changes in arms or legs etc.

## 2021-03-24 NOTE — ED Provider Notes (Signed)
[facility]    CSN: [id number] Arrival date & time: 03/24/21  1532      History   Chief Complaint Chief Complaint  Patient presents with  . Torticollis    HPI Alice Humphrey is a 25 y.o. female presenting with neck pain.  States she woke herself up this morning by turning in her sleep and hearing a pop from her neck.  Awoke with right-sided neck pain radiating to the back of her shoulder.  Has tried ice without improvement.  Has not taken any pain medications including Tylenol and ibuprofen.  It hurts a lot to turn her head to the right side.  Denies pain or weakness in arms or legs, denies weakness or sensation changes in arms or legs.  HPI  Past Medical History:  Diagnosis Date  . Medical history non-contributory   . Pre-diabetes 2006    Patient Active Problem List   Diagnosis Date Noted  . BMI 38.0-38.9,adult 02/16/2019  . Oral contraceptive prescribed 02/16/2019    Past Surgical History:  Procedure Laterality Date  . NO PAST SURGERIES      OB History    Gravida  1   Para  1   Term  1   Preterm      AB      Living  1     SAB      IAB      Ectopic      Multiple  0   Live Births  1            Home Medications    Prior to Admission medications   Medication Sig Start Date End Date Taking? Authorizing Provider  tizanidine (ZANAFLEX) 2 MG capsule Take 1 capsule (2 mg total) by mouth 3 (three) times daily. 03/24/21  Yes Rhys Martini, PA-C  ALTAVERA 0.15-30 MG-MCG tablet TAKE 1 TABLET BY MOUTH EVERY DAY 07/24/20   Brock Bad, MD    Family History Family History  Problem Relation Age of Onset  . AAA (abdominal aortic aneurysm) Mother   . Hypertension Maternal Grandmother   . Diabetes Maternal Grandmother   . Hypertension Maternal Grandfather   . Diabetes Maternal Grandfather   . COPD Maternal Grandfather     Social History Social History   Tobacco Use  . Smoking status: Never Smoker  . Smokeless tobacco: Never Used   Vaping Use  . Vaping Use: Never used  Substance Use Topics  . Alcohol use: Not Currently  . Drug use: Yes    Types: Marijuana    Comment: last smoked approximately 6 months ago (4/2019ish)     Allergies   Patient has no known allergies.   Review of Systems Review of Systems  Musculoskeletal: Positive for neck pain and neck stiffness.  All other systems reviewed and are negative.    Physical Exam Triage Vital Signs ED Triage Vitals  Enc Vitals Group     BP 03/24/21 1611 120/71     Pulse Rate 03/24/21 1611 71     Resp 03/24/21 1611 18     Temp 03/24/21 1611 99.5 F (37.5 C)     Temp Source 03/24/21 1611 Oral     SpO2 03/24/21 1611 99 %     Weight --      Height --      Head Circumference --      Peak Flow --      Pain Score 03/24/21 1609 10     Pain Loc --  Pain Edu? --      Excl. in GC? --    No data found.  Updated Vital Signs BP 120/71 (BP Location: Right Arm)   Pulse 71   Temp 99.5 F (37.5 C) (Oral)   Resp 18   LMP 03/17/2021   SpO2 99%   Breastfeeding No   Visual Acuity Right Eye Distance:   Left Eye Distance:   Bilateral Distance:    Right Eye Near:   Left Eye Near:    Bilateral Near:     Physical Exam Vitals reviewed.  Constitutional:      Appearance: Normal appearance.  HENT:     Head: Normocephalic and atraumatic.  Neck:     Comments: Right-sided cervical paraspinous muscle tenderness to deep palpation.  Right proximal trapezius muscle tenderness to deep palpation.  Pain elicited with flexion of cervical spine and abduction of right arm.  No weakness or sensation changes in arms or legs, strength 5 out of 5 in arms and legs. Cardiovascular:     Rate and Rhythm: Normal rate and regular rhythm.     Heart sounds: Normal heart sounds.  Pulmonary:     Effort: Pulmonary effort is normal.     Breath sounds: Normal breath sounds.  Neurological:     General: No focal deficit present.     Mental Status: She is alert.  Psychiatric:         Mood and Affect: Mood normal.        Behavior: Behavior normal.        Thought Content: Thought content normal.        Judgment: Judgment normal.      UC Treatments / Results  Labs (all labs ordered are listed, but only abnormal results are displayed) Labs Reviewed - No data to display  EKG   Radiology No results found.  Procedures Procedures (including critical care time)  Medications Ordered in UC Medications  ketorolac (TORADOL) injection 60 mg (60 mg Intramuscular Given 03/24/21 1710)    Initial Impression / Assessment and Plan / UC Course  I have reviewed the triage vital signs and the nursing notes.  Pertinent labs & imaging results that were available during my care of the patient were reviewed by me and considered in my medical decision making (see chart for details).     This patient is a 25 year old female presenting with trapezius muscle strain. Toradol administered today. States she is not pregnant. Has not taken other NSAIDs today.  Zanaflex as below. Continue heat, ROM exercises at home.   ED return precautions discussed.  This chart was dictated using voice recognition software, Dragon. Despite the best efforts of this provider to proofread and correct errors, errors may still occur which can change documentation meaning.   Final Clinical Impressions(s) / UC Diagnoses   Final diagnoses:  Trapezius muscle spasm     Discharge Instructions     -Start the muscle relaxer-Zanaflex (tizanidine), up to 3 times daily for muscle spasms and pain.  This can make you drowsy, so take at bedtime or when you do not need to drive or operate machinery. -You can still take Tylenol tonight, but avoid other NSAIDs like ibuprofen since we gave you a shot of pain medication. -Starting tomorrow- for pain, you can Take Tylenol 1000 mg 3 times daily, and ibuprofen 800 mg 3 times daily with food.  You can take these together, or alternate every 3-4 hours. -Seek  additional medical attention if your symptoms persist despite treatment,  like worsening of pain, weakness or sensation changes in arms or legs etc.     ED Prescriptions    Medication Sig Dispense Auth. Provider   tizanidine (ZANAFLEX) 2 MG capsule Take 1 capsule (2 mg total) by mouth 3 (three) times daily. 21 capsule Rhys Martini, PA-C     PDMP not reviewed this encounter.   Rhys Martini, PA-C 03/24/21 1712

## 2021-06-16 ENCOUNTER — Encounter (HOSPITAL_COMMUNITY): Payer: Self-pay

## 2021-06-16 ENCOUNTER — Ambulatory Visit (HOSPITAL_COMMUNITY)
Admission: EM | Admit: 2021-06-16 | Discharge: 2021-06-16 | Disposition: A | Discharge status: Home / Self Care | Payer: Medicaid Other | Attending: Medical Oncology | Admitting: Medical Oncology

## 2021-06-16 ENCOUNTER — Other Ambulatory Visit: Payer: Self-pay

## 2021-06-16 DIAGNOSIS — L03115 Cellulitis of right lower limb: Secondary | ICD-10-CM

## 2021-06-16 MED ORDER — DOXYCYCLINE HYCLATE 100 MG PO CAPS: 100.0000 mg | ORAL_CAPSULE | Freq: Two times a day (BID) | ORAL | 0 refills | Status: DC

## 2021-06-16 MED ORDER — TRIAMCINOLONE ACETONIDE 0.1 % EX CREA: 1.0000 "application " | TOPICAL_CREAM | Freq: Two times a day (BID) | CUTANEOUS | 0 refills | Status: DC

## 2021-06-16 NOTE — ED Provider Notes (Signed)
[facility]    CSN: [id number] Arrival date & time: 06/16/21  1018      History   Chief Complaint Chief Complaint  Patient presents with   Insect Bite    HPI Camiyah A Topor is a 25 y.o. female.   HPI   Insect Bite: Patient states that she has had a suspected insect bite of her right leg for the past 2 days.  She states that it is painful in nature with a burning and tenderness sensation. Redness appears to be swelling. No new products or medications. She has not applied anything to it. She denies fever, vomiting or history of MRSA. [facility]: less than 4 weeks ago.   Past Medical History:  Diagnosis Date   Medical history non-contributory    Pre-diabetes 2006    Patient Active Problem List   Diagnosis Date Noted   BMI 38.0-38.9,adult 02/16/2019   Oral contraceptive prescribed 02/16/2019    Past Surgical History:  Procedure Laterality Date   NO PAST SURGERIES      OB History     Gravida  1   Para  1   Term  1   Preterm      AB      Living  1      SAB      IAB      Ectopic      Multiple  0   Live Births  1            Home Medications    Prior to Admission medications   Medication Sig Start Date End Date Taking? Authorizing Provider  ALTAVERA 0.15-30 MG-MCG tablet TAKE 1 TABLET BY MOUTH EVERY DAY 07/24/20   Brock Bad, MD  tizanidine (ZANAFLEX) 2 MG capsule Take 1 capsule (2 mg total) by mouth 3 (three) times daily. 03/24/21   Rhys Martini, PA-C    Family History Family History  Problem Relation Age of Onset   AAA (abdominal aortic aneurysm) Mother    Hypertension Maternal Grandmother    Diabetes Maternal Grandmother    Hypertension Maternal Grandfather    Diabetes Maternal Grandfather    COPD Maternal Grandfather     Social History Social History   Tobacco Use   Smoking status: Never   Smokeless tobacco: Never  Vaping Use   Vaping Use: Never used  Substance Use Topics   Alcohol use: Not Currently   Drug  use: Yes    Types: Marijuana    Comment: last smoked approximately 6 months ago (4/2019ish)     Allergies   Patient has no known allergies.   Review of Systems Review of Systems  As stated above in HPI Physical Exam Triage Vital Signs ED Triage Vitals  Enc Vitals Group     BP 06/16/21 1107 109/73     Pulse Rate 06/16/21 1107 75     Resp 06/16/21 1107 16     Temp 06/16/21 1107 98.4 F (36.9 C)     Temp Source 06/16/21 1107 Oral     SpO2 06/16/21 1107 100 %     Weight --      Height --      Head Circumference --      Peak Flow --      Pain Score 06/16/21 1106 7     Pain Loc --      Pain Edu? --      Excl. in GC? --    No data found.  Updated Vital  Signs BP 109/73 (BP Location: Right Arm)   Pulse 75   Temp 98.4 F (36.9 C) (Oral)   Resp 16   LMP  (Within Months) Comment: 1 month  SpO2 100%   Physical Exam Vitals and nursing note reviewed.  Constitutional:      General: She is not in acute distress.    Appearance: Normal appearance. She is not ill-appearing, toxic-appearing or diaphoretic.  Cardiovascular:     Rate and Rhythm: Normal rate and regular rhythm.     Heart sounds: Normal heart sounds.  Pulmonary:     Effort: Pulmonary effort is normal.     Breath sounds: Normal breath sounds.  Skin:    General: Skin is warm.     Comments: There is a 1 cm erythematic round area of raised skin with scant blister formation of the right mid shin with 2 inch halo of erythema. NO central clearing.   Neurological:     Mental Status: She is alert.     UC Treatments / Results  Labs (all labs ordered are listed, but only abnormal results are displayed) Labs Reviewed - No data to display  EKG   Radiology No results found.  Procedures Procedures (including critical care time)  Medications Ordered in UC Medications - No data to display  Initial Impression / Assessment and Plan / UC Course  I have reviewed the triage vital signs and the nursing  notes.  Pertinent labs & imaging results that were available during my care of the patient were reviewed by me and considered in my medical decision making (see chart for details).     New.  Appears to be cellulitic in nature.  Treating with doxycycline as well as a topical steroid as this also could potentially represent an inflammatory reaction to something such as an insect bite.  Discussed how to use along with common potential side effects and precautions. Final Clinical Impressions(s) / UC Diagnoses   Final diagnoses:  None   Discharge Instructions   None    ED Prescriptions   None    PDMP not reviewed this encounter.   Rushie Chestnut, [place] 06/16/21 1153

## 2021-06-16 NOTE — ED Triage Notes (Signed)
Pt presents with insect bite in  the right leg x 2 days,. Reports tenderness and burring sensation.

## 2021-06-21 ENCOUNTER — Other Ambulatory Visit: Payer: Self-pay

## 2021-06-21 ENCOUNTER — Ambulatory Visit (HOSPITAL_COMMUNITY)
Admission: EM | Admit: 2021-06-21 | Discharge: 2021-06-21 | Disposition: A | Discharge status: Home / Self Care | Payer: Medicaid Other

## 2021-06-21 ENCOUNTER — Telehealth (HOSPITAL_COMMUNITY): Payer: Self-pay | Admitting: Family Medicine

## 2021-06-21 ENCOUNTER — Encounter (HOSPITAL_COMMUNITY): Payer: Self-pay | Admitting: Emergency Medicine

## 2021-06-21 DIAGNOSIS — L989 Disorder of the skin and subcutaneous tissue, unspecified: Secondary | ICD-10-CM | POA: Diagnosis not present

## 2021-06-21 DIAGNOSIS — W57XXXD Bitten or stung by nonvenomous insect and other nonvenomous arthropods, subsequent encounter: Secondary | ICD-10-CM

## 2021-06-21 DIAGNOSIS — S80861D Insect bite (nonvenomous), right lower leg, subsequent encounter: Secondary | ICD-10-CM | POA: Diagnosis not present

## 2021-06-21 MED ORDER — IBUPROFEN 800 MG PO TABS: 800.0000 mg | ORAL_TABLET | Freq: Three times a day (TID) | ORAL | 0 refills | Status: DC

## 2021-06-21 NOTE — Telephone Encounter (Signed)
Ibuprofen sent pertaining to her visit this morning for pain prn

## 2021-06-21 NOTE — ED Provider Notes (Signed)
[facility]    CSN: [id number] Arrival date & time: 06/21/21  0827      History   Chief Complaint Chief Complaint  Patient presents with   Insect Bite    F/u from Sunday - has not improved    HPI Alice Humphrey is a 25 y.o. female.   Patient presenting today with a painful, red area to anterior right lower leg for which she was seen several days ago here in this urgent care.  She thinks that she was bitten by a spider in this area but did not see the bite take place.  Was placed on triamcinolone cream and doxycycline initially which she has been taking faithfully with some improvement.  Her main concern is the ongoing pain which is burning in nature to the area.  Denies drainage, fevers, chills, myalgias.   Past Medical History:  Diagnosis Date   Medical history non-contributory    Pre-diabetes 2006    Patient Active Problem List   Diagnosis Date Noted   BMI 38.0-38.9,adult 02/16/2019   Oral contraceptive prescribed 02/16/2019    Past Surgical History:  Procedure Laterality Date   NO PAST SURGERIES      OB History     Gravida  1   Para  1   Term  1   Preterm      AB      Living  1      SAB      IAB      Ectopic      Multiple  0   Live Births  1            Home Medications    Prior to Admission medications   Medication Sig Start Date End Date Taking? Authorizing Provider  doxycycline (VIBRAMYCIN) 100 MG capsule Take 1 capsule (100 mg total) by mouth 2 (two) times daily. 06/16/21  Yes Rushie Chestnut, PA-C  ALTAVERA 0.15-30 MG-MCG tablet TAKE 1 TABLET BY MOUTH EVERY DAY 07/24/20   Brock Bad, MD  ibuprofen (ADVIL) 800 MG tablet Take 1 tablet (800 mg total) by mouth 3 (three) times daily. 06/21/21   Particia Nearing, PA-C  tizanidine (ZANAFLEX) 2 MG capsule Take 1 capsule (2 mg total) by mouth 3 (three) times daily. 03/24/21   Rhys Martini, PA-C  triamcinolone cream (KENALOG) 0.1 % Apply 1 application topically 2 (two)  times daily. 06/16/21   Rushie Chestnut, PA-C    Family History Family History  Problem Relation Age of Onset   AAA (abdominal aortic aneurysm) Mother    Hypertension Maternal Grandmother    Diabetes Maternal Grandmother    Hypertension Maternal Grandfather    Diabetes Maternal Grandfather    COPD Maternal Grandfather     Social History Social History   Tobacco Use   Smoking status: Never   Smokeless tobacco: Never  Vaping Use   Vaping Use: Never used  Substance Use Topics   Alcohol use: Not Currently   Drug use: Yes    Types: Marijuana    Comment: last smoked approximately 6 months ago (4/2019ish)     Allergies   Patient has no known allergies.   Review of Systems Review of Systems Per HPI  Physical Exam Triage Vital Signs ED Triage Vitals  Enc Vitals Group     BP 06/21/21 0930 115/68     Pulse Rate 06/21/21 0930 61     Resp 06/21/21 0930 16     Temp 06/21/21  0930 98.8 F (37.1 C)     Temp Source 06/21/21 0930 Oral     SpO2 06/21/21 0930 100 %     Weight --      Height --      Head Circumference --      Peak Flow --      Pain Score 06/21/21 0929 8     Pain Loc --      Pain Edu? --      Excl. in GC? --    No data found.  Updated Vital Signs BP 115/68   Pulse 61   Temp 98.8 F (37.1 C) (Oral)   Resp 16   LMP 05/24/2021   SpO2 100%   Visual Acuity Right Eye Distance:   Left Eye Distance:   Bilateral Distance:    Right Eye Near:   Left Eye Near:    Bilateral Near:     Physical Exam Vitals and nursing note reviewed.  Constitutional:      Appearance: Normal appearance. She is not ill-appearing.  HENT:     Head: Atraumatic.  Eyes:     Extraocular Movements: Extraocular movements intact.     Conjunctiva/sclera: Conjunctivae normal.  Cardiovascular:     Rate and Rhythm: Normal rate and regular rhythm.     Heart sounds: Normal heart sounds.  Pulmonary:     Effort: Pulmonary effort is normal.     Breath sounds: Normal breath sounds.   Musculoskeletal:        General: Normal range of motion.     Cervical back: Normal range of motion and neck supple.  Skin:    General: Skin is warm and dry.     Findings: Erythema present.     Comments: 1 cm erythematous nodular lesion to right anterior lower leg, tender to palpation.  No fluctuance or induration, no active drainage.  Neurological:     Mental Status: She is alert and oriented to person, place, and time.  Psychiatric:        Mood and Affect: Mood normal.        Thought Content: Thought content normal.        Judgment: Judgment normal.     UC Treatments / Results  Labs (all labs ordered are listed, but only abnormal results are displayed) Labs Reviewed - No data to display  EKG   Radiology No results found.  Procedures Procedures (including critical care time)  Medications Ordered in UC Medications - No data to display  Initial Impression / Assessment and Plan / UC Course  I have reviewed the triage vital signs and the nursing notes.  Pertinent labs & imaging results that were available during my care of the patient were reviewed by me and considered in my medical decision making (see chart for details).     Suspect spider bite, inflammatory reaction secondary to this.  Complete antibiotic to cover for infection, continue triamcinolone cream, ice off-and-on, leg elevation.  Ibuprofen as needed for pain relief.  Work note given per her request for rest and elevation.  Follow-up with worsening symptoms.  Final Clinical Impressions(s) / UC Diagnoses   Final diagnoses:  Insect bite of right lower leg, subsequent encounter  Skin lesion   Discharge Instructions   None    ED Prescriptions   None    PDMP not reviewed this encounter.   Particia Nearing, [place] 06/21/21 [phone]

## 2021-06-21 NOTE — ED Triage Notes (Signed)
Follow up from visit Sunday, insect bite to right leg is not improving.

## 2021-08-06 ENCOUNTER — Other Ambulatory Visit: Payer: Self-pay | Admitting: Obstetrics

## 2021-08-06 DIAGNOSIS — Z30011 Encounter for initial prescription of contraceptive pills: Secondary | ICD-10-CM

## 2021-08-13 ENCOUNTER — Other Ambulatory Visit: Payer: Self-pay

## 2021-08-13 ENCOUNTER — Other Ambulatory Visit: Payer: Self-pay | Admitting: Podiatry

## 2021-08-13 ENCOUNTER — Ambulatory Visit (INDEPENDENT_AMBULATORY_CARE_PROVIDER_SITE_OTHER): Payer: Medicaid Other | Admitting: Podiatry

## 2021-08-13 ENCOUNTER — Encounter: Payer: Self-pay | Admitting: Podiatry

## 2021-08-13 ENCOUNTER — Ambulatory Visit (INDEPENDENT_AMBULATORY_CARE_PROVIDER_SITE_OTHER): Payer: Medicaid Other

## 2021-08-13 DIAGNOSIS — M722 Plantar fascial fibromatosis: Secondary | ICD-10-CM

## 2021-08-13 DIAGNOSIS — M7750 Other enthesopathy of unspecified foot: Secondary | ICD-10-CM

## 2021-08-13 MED ORDER — MELOXICAM 15 MG PO TABS: 15.0000 mg | ORAL_TABLET | Freq: Every day | ORAL | 3 refills | Status: DC

## 2021-08-13 NOTE — Progress Notes (Signed)
  Subjective:  Patient ID: Alice Humphrey, female    DOB: 23-Jun-1996,  MRN: [id number]  Chief Complaint  Patient presents with   Foot Pain    (np) FOOT PAIN BILAT    25 y.o. female presents with the above complaint. History confirmed with patient.  Most of the pain is in the arch bilaterally.  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  Her mother and grandmother also had plantar fasciitis and needing surgery for it  Objective:  Physical Exam: warm, good capillary refill, no trophic changes or ulcerative lesions, normal DP and PT pulses, and normal sensory exam. Left Foot: point tenderness over the heel pad and point tenderness of the mid plantar fascia Right Foot: point tenderness over the heel pad and point tenderness of the mid plantar fascia   Radiographs: Multiple views x-ray of both feet: no fracture, dislocation, swelling or degenerative changes noted and plantar calcaneal spur Assessment:   1. Plantar fasciitis of left foot   2. Plantar fasciitis of right foot      Plan:  Patient was evaluated and treated and all questions answered.  Discussed the etiology and treatment options for plantar fasciitis including stretching, formal physical therapy, supportive shoegears such as a running shoe or sneaker, pre fabricated orthoses, injection therapy, and oral medications. We also discussed the role of surgical treatment of this for patients who do not improve after exhausting non-surgical treatment options.   -XR reviewed with patient -Educated patient on stretching and icing of the affected limb -Injection delivered to the plantar fascia of both feet. -Rx for meloxicam. Educated on use, risks and benefits of the medication -Physical therapy referral and sent to [facility]  After sterile prep with povidone-iodine solution and alcohol, the bilateral heel was injected with 0.5cc 2% xylocaine plain, 0.5cc 0.5% marcaine plain, 5mg  triamcinolone acetonide, and 2mg   dexamethasone was injected along the medial plantar fascia at the insertion on the plantar calcaneus. The patient tolerated the procedure well without complication.   Return in about 1 month (around 09/13/2021) for recheck plantar fasciitis.

## 2021-08-28 ENCOUNTER — Other Ambulatory Visit: Payer: Self-pay

## 2021-08-28 ENCOUNTER — Ambulatory Visit: Payer: Medicaid Other | Attending: Podiatry

## 2021-08-28 DIAGNOSIS — M79672 Pain in left foot: Secondary | ICD-10-CM | POA: Diagnosis present

## 2021-08-28 DIAGNOSIS — M6281 Muscle weakness (generalized): Secondary | ICD-10-CM | POA: Diagnosis present

## 2021-08-28 DIAGNOSIS — M79671 Pain in right foot: Secondary | ICD-10-CM | POA: Diagnosis not present

## 2021-08-28 NOTE — Therapy (Signed)
[facility] Outpatient Rehabilitation [facility] [address] [place], [place], [address] Phone: [phone]   Fax:  [phone]  Physical Therapy Evaluation  Patient Details  Name: Alice Humphrey MRN: [id number] Date of Birth: June 17, 1996 Referring Provider (PT): Edwin Cap   Encounter Date: 08/28/2021   PT End of Session - 08/28/21 1739     Visit Number 1    Number of Visits 9    Date for PT Re-Evaluation 10/23/21    Authorization Type [place] MCD    Authorization Time Period Pending Authorization    PT Start Time 1655    PT Stop Time 1740    PT Time Calculation (min) 45 min    Activity Tolerance Patient tolerated treatment well    Behavior During Therapy [facility] for tasks assessed/performed             Past Medical History:  Diagnosis Date   Medical history non-contributory    Pre-diabetes 2006    Past Surgical History:  Procedure Laterality Date   NO PAST SURGERIES      There were no vitals filed for this visit.    Subjective Assessment - 08/28/21 1655     Subjective Pt reports primary c/o BIL foot and heel pain of insidious onset lasting about 10 years. She reports first step pain in the mornings or after prolonged sitting. Her pain is most prevelant with prolonged standing and walking after prolonged standing. 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 She received a cortisone shot in BIL heels about 2 weeks ago, with positive results. Her pain at the worst is rated at 9/10 after long days of standing.    Limitations Standing;Walking    How long can you sit comfortably? Unlimited    How long can you stand comfortably? 2-3 hours    How long can you walk comfortably? 2-3 hours    Diagnostic tests 08/13/2021: Radiographs:Multiple views x-ray of  both feet: no fracture, dislocation, swelling or degenerative changes noted and plantar calcaneal spur    Patient Stated Goals Work without pain, walking    Currently in Pain? No/denies    Aggravating Factors  Prolonged standing, walking    Pain Relieving Factors Iced water bottle rolling, stretches, rest    Effect of Pain on Daily Activities Difficulty at work                [facility] PT Assessment - 08/28/21 0001       Assessment   Medical Diagnosis Plantar fasciitis of left foot (M72.2), Plantar fasciitis of right foot (M72.2)    Referring Provider (PT) Edwin Cap    Onset Date/Surgical Date 08/29/11    Hand Dominance Right    Next MD Visit in 2 weeks with PCP    Prior Therapy No      Precautions   Precautions None      Restrictions   Weight Bearing Restrictions No      Balance Screen   Has the patient fallen in the past 6 months No    Has the patient had a decrease in activity level because of a fear of falling?  No    Is the patient reluctant to leave their home because of a fear of falling?  No      Home Environment  Living Environment Private residence    Living Arrangements Children    Available Help at Discharge Family    Type of Home House    Home Access Stairs to enter    Entrance Stairs-Number of Steps 3    Entrance Stairs-Rails Right;Left;Can reach both    Home Layout Two level    Alternate Level Stairs-Number of Steps 20    Alternate Level Stairs-Rails Right;Left;Can reach both      Prior Function   Level of Independence Independent      Observation/Other Assessments   Observations BIL pes planus (+) too many toes signs      Functional Tests   Functional tests Hopping;Single leg stance      Hopping   Comments DL x3 WNL; SL on L x3 WNL; SL on R x3 WNL with pain/ tightness in medial arch      Single Leg Stance   Comments WNL BIL      Other:   Other/ Comments Heel raise: DL F62 WNL: SL Z30 BIL, tightness on R      AROM   Right Ankle  Dorsiflexion 10    Right Ankle Plantar Flexion 65    Right Ankle Inversion 30    Right Ankle Eversion 5    Left Ankle Dorsiflexion 15    Left Ankle Plantar Flexion 50    Left Ankle Inversion 40    Left Ankle Eversion 10      PROM   Right Ankle Dorsiflexion 20    Right Ankle Plantar Flexion 65    Right Ankle Inversion 35    Right Ankle Eversion 15    Left Ankle Dorsiflexion 20    Left Ankle Plantar Flexion 55    Left Ankle Inversion 45    Left Ankle Eversion 15      Strength   Right Hip Flexion 5/5    Right Hip Extension 4/5    Right Hip ABduction 4/5    Left Hip Flexion 5/5    Left Hip Extension 4/5    Left Hip ABduction 4/5    Right Knee Flexion 5/5    Right Knee Extension 5/5    Left Knee Flexion 5/5    Left Knee Extension 5/5    Right Ankle Dorsiflexion 5/5    Right Ankle Plantar Flexion 5/5    Right Ankle Inversion 5/5    Right Ankle Eversion 5/5    Left Ankle Dorsiflexion 5/5    Left Ankle Plantar Flexion 5/5    Left Ankle Inversion 5/5    Left Ankle Eversion 5/5      Flexibility   Soft Tissue Assessment /Muscle Length yes   BIL gastrocnemius and soleus WNL     Palpation   Palpation comment TTP to BIL plantar calcaneus and medial arch      Special Tests    Special Tests Ankle/Foot Special Tests    Ankle/Foot Special Tests  --   Windlass test (+) on R, too many toes sign (+) BIL                       Objective measurements completed on examination: See above findings.                PT Education - 08/28/21 1738     Education Details Pt educated on probably pathophysioloy behind her pain presentation, along with proper form with HEP    Person(s) Educated Patient    Methods Explanation;Demonstration;Handout  Comprehension Verbalized understanding;Returned demonstration              PT Short Term Goals - 08/28/21 1745       PT SHORT TERM GOAL #1   Title Pt will report understanding and adherence to her HEP in order to  promote independence in the management of her primary sxs.    Baseline HEP provided at eval    Time 4    Period Weeks    Status New    Target Date 09/25/21               PT Long Term Goals - 08/28/21 1746       PT LONG TERM GOAL #1   Title Pt will complete 15 SL heel raises BIL with 0/10 pain in order to traverse stairs at home with good mechanics and without difficulty.    Baseline pain and tightness on R    Time 8    Period Weeks    Status New    Target Date 10/23/21      PT LONG TERM GOAL #2   Title Pt will report ability to stand >4 hours with 0/10 pain in order to work a full work day without limitation due to her plantar pain.    Baseline able to stand 2-3 hours before severe pain    Time 8    Period Weeks    Status New    Target Date 10/23/21      PT LONG TERM GOAL #3   Title Pt will achieve BIL global hip strength of 5/5 in order to progress LE strengthening program without limitation.    Baseline BIL hip abduction and extension = 4/5    Time 8    Period Weeks    Status New    Target Date 10/23/21                    Plan - 08/28/21 1739     Clinical Impression Statement Pt is a pleasant 24yo F who presents to PT with primary c/o chronic plantar foot and heel pain. Upon assessment, her primary impairments include discomfort with R SL hopping and heel raises, TTP to BIL plantar calcaneus and medial arch, BIL pes planus, and weakness in BIL hip abduction and extension. These findings, along with positive Windlass testing and subjective hx of first step pain are indicative of probable BIL plantar fasciitis. The pt will benefit from skilled PT to address her primary impairments and return to her prior level of function without limitation due to pain.    Examination-Activity Limitations Locomotion Level;Stand    Examination-Participation Restrictions Occupation;Community Activity;Shop    Stability/Clinical Decision Making Stable/Uncomplicated    Clinical  Decision Making Low    Rehab Potential Good    PT Frequency 1x / week    PT Duration 8 weeks    PT Treatment/Interventions ADLs/Self Care Home Management;Cryotherapy;DME Instruction;Ultrasound;Moist Heat;Iontophoresis 4mg /ml Dexamethasone;Gait training;Therapeutic activities;Therapeutic exercise;Balance training;Neuromuscular re-education;Manual techniques;Joint Manipulations;Vasopneumatic Device;Taping;Patient/family education    PT Next Visit Plan Progress posterior tibialis strengthening, eccentric calf strengthening, aerobics as able    PT Home Exercise Plan [email or web address]    Consulted and Agree with Plan of Care Patient             Patient will benefit from skilled therapeutic intervention in order to improve the following deficits and impairments:  Abnormal gait, Difficulty walking, Increased fascial restricitons, Pain, Improper body mechanics, Decreased strength  Visit Diagnosis: Pain in right foot  Pain  in left foot  Muscle weakness (generalized)     Problem List Patient Active Problem List   Diagnosis Date Noted   BMI 38.0-38.9,adult 02/16/2019   Oral contraceptive prescribed 02/16/2019    Carmelina DaneYarborough, Fidencia Mccloud, PT, DPT 08/28/21 5:50 PM   [facility] [address] [place], [place], [address] Phone: [phone]   Fax:  [phone]  Name: Alice Humphrey MRN: [id number] Date of Birth: [birth year]

## 2021-08-28 NOTE — Patient Instructions (Signed)
  [email or web address] 

## 2021-09-11 ENCOUNTER — Ambulatory Visit: Payer: Medicaid Other

## 2021-09-17 ENCOUNTER — Ambulatory Visit: Payer: Medicaid Other | Admitting: Podiatry

## 2021-09-17 ENCOUNTER — Other Ambulatory Visit: Payer: Self-pay

## 2021-09-17 ENCOUNTER — Ambulatory Visit: Payer: Medicaid Other

## 2021-09-17 DIAGNOSIS — M79671 Pain in right foot: Secondary | ICD-10-CM | POA: Diagnosis not present

## 2021-09-17 DIAGNOSIS — M6281 Muscle weakness (generalized): Secondary | ICD-10-CM

## 2021-09-17 DIAGNOSIS — M79672 Pain in left foot: Secondary | ICD-10-CM

## 2021-09-17 NOTE — Therapy (Signed)
[facility] Outpatient Rehabilitation [facility] [address] [place], [place], [address] Phone: [phone]   Fax:  [phone]  Physical Therapy Treatment  Patient Details  Name: Kiari Hosmer Garmany MRN: [id number] Date of Birth: [birth year] Referring Provider (PT): Edwin Cap   Encounter Date: 09/17/2021   PT End of Session - 09/17/21 1614     Visit Number 2    Number of Visits 9    Date for PT Re-Evaluation 10/23/21    Authorization Type [place] MCD    Authorization Time Period Pending Authorization    PT Start Time 1614    PT Stop Time 1653    PT Time Calculation (min) 39 min    Activity Tolerance Patient tolerated treatment well    Behavior During Therapy [facility] for tasks assessed/performed             Past Medical History:  Diagnosis Date   Medical history non-contributory    Pre-diabetes 2006    Past Surgical History:  Procedure Laterality Date   NO PAST SURGERIES      There were no vitals filed for this visit.   Subjective Assessment - 09/17/21 1614     Subjective Pt presents to PT with reports of improving symptoms, with reports now being more like a "cramping" sensation. Has been compliant with HEP with no adverse effect. Ready to begin PT at this time.    Currently in Pain? Yes    Pain Score 4     Pain Location Foot    Pain Orientation Right           [facility] Adult PT Treatment/Exercise:   Therapeutic Exercise:  Bike lvl 3 x 4 min while taking subjective Towel crunch x 60 sec ea Calf stretch w/ towel 2x30 sec ea Ankle DF/Inv/ev 2x10 red tband Slant board 3x30 sec Eccentric heel raise 2x10 6 in step  Manual Therapy:  IASTM to bilat plantar fascia for decreasing pain and soft tissue irritation                             PT Education - 09/17/21 1655     Education Details gave tennis ball for massage to bilat plantar fascia at home    Person(s) Educated Patient    Methods Explanation;Demonstration     Comprehension Verbalized understanding;Returned demonstration              PT Short Term Goals - 08/28/21 1745       PT SHORT TERM GOAL #1   Title Pt will report understanding and adherence to her HEP in order to promote independence in the management of her primary sxs.    Baseline HEP provided at eval    Time 4    Period Weeks    Status New    Target Date 09/25/21               PT Long Term Goals - 08/28/21 1746       PT LONG TERM GOAL #1   Title Pt will complete 15 SL heel raises BIL with 0/10 pain in order to traverse stairs at home with good mechanics and without difficulty.    Baseline pain and tightness on R    Time 8    Period Weeks    Status New    Target Date 10/23/21      PT LONG TERM GOAL #2   Title Pt will report ability to stand >4  hours with 0/10 pain in order to work a full work day without limitation due to her plantar pain.    Baseline able to stand 2-3 hours before severe pain    Time 8    Period Weeks    Status New    Target Date 10/23/21      PT LONG TERM GOAL #3   Title Pt will achieve BIL global hip strength of 5/5 in order to progress LE strengthening program without limitation.    Baseline BIL hip abduction and extension = 4/5    Time 8    Period Weeks    Status New    Target Date 10/23/21                   Plan - 09/17/21 1656     Clinical Impression Statement Pt was able to complete prescribed execises with no adverse effect or increase in pain. Today's session focused on improving foot intrinsic strength and decreasing pain. She responded well to interventions, noting 0/10 pain at end of session. Pt should continue to improve with HEP compliance and will continue to be seen and progressed as tolerated.    PT Treatment/Interventions ADLs/Self Care Home Management;Cryotherapy;DME Instruction;Ultrasound;Moist Heat;Iontophoresis 4mg /ml Dexamethasone;Gait training;Therapeutic activities;Therapeutic exercise;Balance  training;Neuromuscular re-education;Manual techniques;Joint Manipulations;Vasopneumatic Device;Taping;Patient/family education    PT Next Visit Plan Progress posterior tibialis strengthening, eccentric calf strengthening, aerobics as able    PT Home Exercise Plan [facility]             Patient will benefit from skilled therapeutic intervention in order to improve the following deficits and impairments:  Abnormal gait, Difficulty walking, Increased fascial restricitons, Pain, Improper body mechanics, Decreased strength  Visit Diagnosis: Pain in right foot  Pain in left foot  Muscle weakness (generalized)     Problem List Patient Active Problem List   Diagnosis Date Noted   BMI 38.0-38.9,adult 02/16/2019   Oral contraceptive prescribed 02/16/2019    02/18/2019, PT 09/17/2021, 4:58 PM  [facility] [address] [place], [place], [place] Phone: [phone]   Fax:  [phone]  Name: Amiee Wiley Kistner MRN: Ria Clock Date of Birth: 1996-01-22

## 2021-09-24 ENCOUNTER — Ambulatory Visit: Payer: Medicaid Other

## 2021-10-01 ENCOUNTER — Ambulatory Visit: Payer: Medicaid Other

## 2021-10-02 ENCOUNTER — Other Ambulatory Visit: Payer: Self-pay

## 2021-10-02 ENCOUNTER — Ambulatory Visit: Payer: Medicaid Other | Attending: Podiatry

## 2021-10-02 DIAGNOSIS — M79671 Pain in right foot: Secondary | ICD-10-CM | POA: Diagnosis not present

## 2021-10-02 DIAGNOSIS — M79672 Pain in left foot: Secondary | ICD-10-CM | POA: Diagnosis present

## 2021-10-02 DIAGNOSIS — M6281 Muscle weakness (generalized): Secondary | ICD-10-CM | POA: Insufficient documentation

## 2021-10-02 NOTE — Patient Instructions (Signed)
  [email or web address]

## 2021-10-02 NOTE — Therapy (Signed)
[facility] Outpatient Rehabilitation [facility] [address] [place], [place], [address] Phone: [phone]   Fax:  [phone]  Physical Therapy Treatment  Patient Details  Name: Alice Humphrey MRN: [id number] Date of Birth: 02/03/1996 Referring Provider (PT): Edwin Cap   Encounter Date: 10/02/2021   PT End of Session - 10/02/21 1530     Visit Number 3    Number of Visits 9    Date for PT Re-Evaluation 10/23/21    Authorization Type [place] MCD    Authorization Time Period Approved 12 visits from 09/11/2021-11/10/2021    Authorization - Visit Number 2    Authorization - Number of Visits 12    PT Start Time 1455   Pt arrived 10 minutes late to her appointment   PT Stop Time 1530    PT Time Calculation (min) 35 min    Activity Tolerance Patient tolerated treatment well    Behavior During Therapy [facility] for tasks assessed/performed             Past Medical History:  Diagnosis Date   Medical history non-contributory    Pre-diabetes 2006    Past Surgical History:  Procedure Laterality Date   NO PAST SURGERIES      There were no vitals filed for this visit.   Subjective Assessment - 10/02/21 1456     Subjective Pt reports a pattern of improvement in her sxs since starting PT, adding that she only has pain in the mornings. She reports doing her exercises every day.    Currently in Pain? No/denies    Pain Score 0-No pain                               [facility] Adult PT Treatment/Exercise - 10/02/21 0001       Manual Therapy   Manual Therapy Soft tissue mobilization    Soft tissue mobilization IASTM to BIL plantar fascia using fanning technique for plantar fascial motility      Ankle Exercises: Stretches   Gastroc Stretch 2 reps;60 seconds    Gastroc Stretch Limitations Slantboard      Ankle Exercises: Standing   Heel Raises Right;Left    Heel Raises Limitations SL bouncing heel raises on 2-inch step 3x20 BIL    Other  Standing Ankle Exercises Woodpeckers 2x10 BIL                     PT Education - 10/02/21 1519     Education Details Updated HEP    Person(s) Educated Patient    Methods Explanation;Demonstration;Handout    Comprehension Verbalized understanding;Returned demonstration              PT Short Term Goals - 08/28/21 1745       PT SHORT TERM GOAL #1   Title Pt will report understanding and adherence to her HEP in order to promote independence in the management of her primary sxs.    Baseline HEP provided at eval    Time 4    Period Weeks    Status New    Target Date 09/25/21               PT Long Term Goals - 08/28/21 1746       PT LONG TERM GOAL #1   Title Pt will complete 15 SL heel raises BIL with 0/10 pain in order to traverse stairs at home with good mechanics and without  difficulty.    Baseline pain and tightness on R    Time 8    Period Weeks    Status New    Target Date 10/23/21      PT LONG TERM GOAL #2   Title Pt will report ability to stand >4 hours with 0/10 pain in order to work a full work day without limitation due to her plantar pain.    Baseline able to stand 2-3 hours before severe pain    Time 8    Period Weeks    Status New    Target Date 10/23/21      PT LONG TERM GOAL #3   Title Pt will achieve BIL global hip strength of 5/5 in order to progress LE strengthening program without limitation.    Baseline BIL hip abduction and extension = 4/5    Time 8    Period Weeks    Status New    Target Date 10/23/21                   Plan - 10/02/21 1532     Clinical Impression Statement Pt arrived 10 minutes late to her appointment, which led to a truncated treatment session today. She responded well to all interventions today, adding that she experiences a therapeutic effect with IASTM to BIL plantar fascia and with calf stretching. She reports a good challenge with woodpeckers and reports that she can feel the exercise working.  The pt will continue to benefit from skilled PT to address her primary impairments and return to her prior level of function without limitation.    Examination-Activity Limitations Locomotion Level;Stand    Examination-Participation Restrictions Occupation;Community Activity;Shop    Stability/Clinical Decision Making Stable/Uncomplicated    Clinical Decision Making Low    Rehab Potential Good    PT Frequency 1x / week    PT Duration 8 weeks    PT Treatment/Interventions ADLs/Self Care Home Management;Cryotherapy;DME Instruction;Ultrasound;Moist Heat;Iontophoresis 4mg /ml Dexamethasone;Gait training;Therapeutic activities;Therapeutic exercise;Balance training;Neuromuscular re-education;Manual techniques;Joint Manipulations;Vasopneumatic Device;Taping;Patient/family education    PT Next Visit Plan Progress posterior tibialis strengthening, eccentric calf strengthening, aerobics as able    PT Home Exercise Plan [email or web address]    Consulted and Agree with Plan of Care Patient             Patient will benefit from skilled therapeutic intervention in order to improve the following deficits and impairments:  Abnormal gait, Difficulty walking, Increased fascial restricitons, Pain, Improper body mechanics, Decreased strength  Visit Diagnosis: Pain in right foot  Pain in left foot  Muscle weakness (generalized)     Problem List Patient Active Problem List   Diagnosis Date Noted   BMI 38.0-38.9,adult 02/16/2019   Oral contraceptive prescribed 02/16/2019    02/18/2019, PT, DPT 10/02/21 3:35 PM   [facility] Outpatient Rehabilitation [facility] [address] [place], [place], [place] Phone: [phone]   Fax:  [phone]  Name: Alice Humphrey MRN: Ria Clock Date of Birth: 14-Jul-1996

## 2021-10-08 ENCOUNTER — Other Ambulatory Visit: Payer: Self-pay

## 2021-10-08 ENCOUNTER — Ambulatory Visit: Payer: Medicaid Other

## 2021-10-08 DIAGNOSIS — M6281 Muscle weakness (generalized): Secondary | ICD-10-CM

## 2021-10-08 DIAGNOSIS — M79671 Pain in right foot: Secondary | ICD-10-CM

## 2021-10-08 DIAGNOSIS — M79672 Pain in left foot: Secondary | ICD-10-CM

## 2021-10-08 NOTE — Therapy (Signed)
[facility] [address] [place], [place], [address] Phone: [phone]   Fax:  [phone]  Physical Therapy Treatment  Patient Details  Name: Kamille Toomey Leiner MRN: [id number] Date of Birth: 1996-07-05 Referring Provider (PT): Edwin Cap   Encounter Date: 10/08/2021   PT End of Session - 10/08/21 1744     Visit Number 4    Number of Visits 9    Date for PT Re-Evaluation 10/23/21    Authorization Type [place] [facility]    Authorization Time Period Approved 12 visits from 09/11/2021-11/10/2021    Authorization - Visit Number 3    Authorization - Number of Visits 12    PT Start Time 1705    PT Stop Time 1745    PT Time Calculation (min) 40 min    Activity Tolerance Patient tolerated treatment well    Behavior During Therapy [facility] for tasks assessed/performed             Past Medical History:  Diagnosis Date   Medical history non-contributory    Pre-diabetes 2006    Past Surgical History:  Procedure Laterality Date   NO PAST SURGERIES      There were no vitals filed for this visit.   Subjective Assessment - 10/08/21 1710     Subjective Pt reports being sore in her calves for about 3 days following the last visit, during which time, she experienced no foot pain. She reports being adherent to her HEP.    Patient is accompained by: --   Boyfriend   Currently in Pain? No/denies    Pain Score 0-No pain                               [facility] Adult PT Treatment/Exercise - 10/08/21 0001       Ankle Exercises: Stretches   Soleus Stretch 2 reps;60 seconds   On slantboard   Gastroc Stretch 2 reps;60 seconds    Gastroc Stretch Limitations Slantboard      Ankle Exercises: Plyometrics   Bilateral Jumping 3 sets;10 reps;Box Height: 6"   Box drop/ jumps on toes     Ankle Exercises: Machines for Strengthening   Cybex Leg Press Bouncing heel raises 3x10 with 60#      Ankle Exercises: Standing   Other  Standing Ankle Exercises SL RDL 3x10 with two 5# KB's    Other Standing Ankle Exercises Lateral woodpeckers 2x5 BIL                       PT Short Term Goals - 08/28/21 1745       PT SHORT TERM GOAL #1   Title Pt will report understanding and adherence to her HEP in order to promote independence in the management of her primary sxs.    Baseline HEP provided at eval    Time 4    Period Weeks    Status New    Target Date 09/25/21               PT Long Term Goals - 08/28/21 1746       PT LONG TERM GOAL #1   Title Pt will complete 15 SL heel raises BIL with 0/10 pain in order to traverse stairs at home with good mechanics and without difficulty.    Baseline pain and tightness on R    Time 8    Period Weeks  Status New    Target Date 10/23/21      PT LONG TERM GOAL #2   Title Pt will report ability to stand >4 hours with 0/10 pain in order to work a full work day without limitation due to her plantar pain.    Baseline able to stand 2-3 hours before severe pain    Time 8    Period Weeks    Status New    Target Date 10/23/21      PT LONG TERM GOAL #3   Title Pt will achieve BIL global hip strength of 5/5 in order to progress LE strengthening program without limitation.    Baseline BIL hip abduction and extension = 4/5    Time 8    Period Weeks    Status New    Target Date 10/23/21                   Plan - 10/08/21 1744     Clinical Impression Statement Pt responded well to all interventions today, demonstrating proper form and no increase in pain with selected exercises. She reports a good challenge with lateral woodpeckers anand single leg RDL's. The pt will continue to benefit from skilled PT to address her primary impairments and return to her prior level of function without limitation.    Examination-Activity Limitations Locomotion Level;Stand    Examination-Participation Restrictions Occupation;Community Activity;Shop     Stability/Clinical Decision Making Stable/Uncomplicated    Clinical Decision Making Low    Rehab Potential Good    PT Frequency 1x / week    PT Duration 8 weeks    PT Treatment/Interventions ADLs/Self Care Home Management;Cryotherapy;DME Instruction;Ultrasound;Moist Heat;Iontophoresis 4mg /ml Dexamethasone;Gait training;Therapeutic activities;Therapeutic exercise;Balance training;Neuromuscular re-education;Manual techniques;Joint Manipulations;Vasopneumatic Device;Taping;Patient/family education    PT Next Visit Plan Progress posterior tibialis strengthening, eccentric calf strengthening, aerobics as able    PT Home Exercise Plan [email or web address]    Consulted and Agree with Plan of Care Patient             Patient will benefit from skilled therapeutic intervention in order to improve the following deficits and impairments:  Abnormal gait, Difficulty walking, Increased fascial restricitons, Pain, Improper body mechanics, Decreased strength  Visit Diagnosis: Pain in right foot  Pain in left foot  Muscle weakness (generalized)     Problem List Patient Active Problem List   Diagnosis Date Noted   BMI 38.0-38.9,adult 02/16/2019   Oral contraceptive prescribed 02/16/2019    02/18/2019, PT, DPT 10/08/21 5:46 PM   [facility] [address] [place], [place], [place] Phone: [phone]   Fax:  [phone]  Name: Libbey Duce Panella MRN: Ria Clock Date of Birth: [birth year]

## 2021-10-15 ENCOUNTER — Ambulatory Visit: Payer: Medicaid Other

## 2021-10-28 ENCOUNTER — Ambulatory Visit: Payer: Medicaid Other | Attending: Podiatry

## 2021-10-28 ENCOUNTER — Other Ambulatory Visit: Payer: Self-pay

## 2021-10-28 DIAGNOSIS — M79672 Pain in left foot: Secondary | ICD-10-CM | POA: Diagnosis present

## 2021-10-28 DIAGNOSIS — M79671 Pain in right foot: Secondary | ICD-10-CM | POA: Diagnosis present

## 2021-10-28 DIAGNOSIS — M6281 Muscle weakness (generalized): Secondary | ICD-10-CM | POA: Diagnosis present

## 2021-10-28 NOTE — Therapy (Addendum)
[place], [place], [address] Phone: [phone]   Fax:  [phone]  Physical Therapy Treatment/ Re-evaluation/Discharge  Patient Details  Name: Alice Humphrey MRN: [id number] Date of Birth: 1996-12-15 Referring Provider (PT): Criselda Peaches   Encounter Date: 10/28/2021   PT End of Session - 10/28/21 1601     Visit Number 5    Number of Visits 7    Date for PT Re-Evaluation 11/30/21    Authorization Type [place] MCD    Authorization Time Period Approved 12 visits from 09/11/2021-11/10/2021    Authorization - Visit Number 4    Authorization - Number of Visits 12    PT Start Time 1500    PT Stop Time 1545    PT Time Calculation (min) 45 min    Activity Tolerance Patient tolerated treatment well;No increased pain    Behavior During Therapy [facility] for tasks assessed/performed             Past Medical History:  Diagnosis Date   Medical history non-contributory    Pre-diabetes 2006    Past Surgical History:  Procedure Laterality Date   NO PAST SURGERIES      There were no vitals filed for this visit.   Subjective Assessment - 10/28/21 1505     Subjective Pt reports 60% overall improvement in pain and function compared to initial PT evaluation. Pt says she still has some pain intermittentaly when she initially wakes up and at the end of a long work day. Pt says that she wasn't able to come to previous appointment due to falling due to "missing the last step" and hurting her Rt ankle. Pt says she feels "off balance" or "unsteady" on her feet sometimes. Pt reports fear of falling due to "unsteadiness".    Patient is accompained by: --   Boyfriend   How long can you sit comfortably? Unlimited    How long can you stand comfortably? 6-8 hours    How long can you walk comfortably? 6 hours    Patient Stated Goals Pt wants to decrease pain when walking and working.    Currently in Pain? Yes    Pain Score 3      Pain Location Foot    Pain Orientation Right;Left    Pain Descriptors / Indicators Dull    Pain Type Chronic pain    Pain Onset More than a month ago    Pain Frequency Intermittent            [facility] Adult PT Treatment/Exercise:  Therapeutic Exercise: - treadmill grade 5, 1.44mph 67minutes - Single leg anterior step down 3x10 bilateral   Manual Therapy: - NA  Neuromuscular re-ed: - Star excursion balance 2 laps both legs  - Single leg hops 3x5  - SL balance with eyes closed   Therapeutic Activity: - NA  Self-care/Home Management: - see patient education    [facility] PT Assessment - 10/29/21 0001       Observation/Other Assessments   Observations BIL pes planus; otherwise unremarkable      Functional Tests   Functional tests Hopping;Single leg stance      Hopping   Comments DL x3 WNL; SL on L x3 WNL; SL on R x3 WNL; no pain or tightness      Single Leg Stance   Comments EO SLS bilat WFL; EC SLS 18secs on Rt, EC SLS 17secs on Lt      Other:   Other/ Comments Heel  raise: DL x15 WNL: SL x15 BIL WNL      Strength   Right Hip Extension 5/5    Right Hip ABduction 5/5    Left Hip Extension 5/5    Left Hip ABduction 5/5      Palpation   Palpation comment TTP to BIL plantar calcaneus and medial arch Lt > Rt      Special Tests   Ankle/Foot Special Tests  --   Windlass test (+) on R                                   PT Education - 10/28/21 1600     Education Details Pt educated on progress towards goals, re-assessment findings, and updated POC. Pt educated on proper footwear for current condition. Pt given instructions on updated HEP for form and dosage.    Person(s) Educated Patient    Methods Explanation;Demonstration;Handout    Comprehension Verbalized understanding;Returned demonstration              PT Short Term Goals - 10/28/21 1510       PT SHORT TERM GOAL #1   Title Pt will report understanding and adherence to her HEP  in order to promote independence in the management of her primary sxs.    Baseline HEP provided at eval    Time 4    Period Weeks    Status Achieved    Target Date 09/25/21      PT SHORT TERM GOAL #2   Title --    Baseline --    Status --    Target Date --               PT Long Term Goals - 10/28/21 1510       PT LONG TERM GOAL #1   Title Pt will complete 15 SL heel raises BIL with 0/10 pain in order to traverse stairs at home with good mechanics and without difficulty.    Baseline pain and tightness on R    Time --    Period --    Status Achieved    Target Date 10/23/21      PT LONG TERM GOAL #2   Title Pt will report ability to stand >4 hours with 0/10 pain in order to work a full work day without limitation due to her plantar pain.    Baseline able to stand 2-3 hours before severe pain    Time --    Period --    Status Achieved    Target Date 10/23/21      PT LONG TERM GOAL #3   Title Pt will achieve BIL global hip strength of 5/5 in order to progress LE strengthening program without limitation.    Baseline BIL hip abduction and extension = 4/5    Time --    Period --    Status Achieved    Target Date 10/23/21      PT LONG TERM GOAL #4   Title Pt will be able to tolerate 20 second single leg stance with eyes closed without pain to improve proprioception and stability in the ankle and decrease fall risk.    Baseline see flowsheet    Status New    Target Date 11/30/21      PT LONG TERM GOAL #5   Title Pt will report understanding and independance with an advanced HEP in order to promote independence  in the management of her primary symtpoms, balance, and function.    Baseline issued at re-eval    Status New    Target Date 11/30/21                   Plan - 10/28/21 1617     Clinical Impression Statement Patient presents with palpable muscular tightness in bilat plantar fascia Lt > Rt with moderate reported tenderness in these areas Lt > Rt. Pt  has met all short term and long term goals initially set.  Able to add new long term goals to include dynamic single leg balance and incorporating an advanced HEP to further address balance deficits and propcrioceptive deficits in bilat ankles to improve function and fear of falling. Patient gave good effort with all activities. Able to progress HEP today to include dynamic balance exercises without reported increased pain. Pt required significant visual input to maintain single leg balance during dynamic single leg tasks. Pt utilized more ankle strategies on Lt verses Rt with increase compensatory movement patterns to prevent LOB on Lt > Rt. Cues given throughout all activities per procedure list for proper form and technique. Pt will benefit from skilled physical therapy for 1-2 more visits within the next 4 weeks to assess adherence to advanced HEP and changes in dynamic single leg balance to address continued deficits related to her chronic condition and reduce fear of falling.    Examination-Activity Limitations Locomotion Level;Stand    Examination-Participation Restrictions Occupation;Community Activity;Shop    Stability/Clinical Decision Making Stable/Uncomplicated    Rehab Potential Good    PT Frequency Other (comment)   1-2 visits   PT Duration 4 weeks    PT Treatment/Interventions ADLs/Self Care Home Management;Cryotherapy;DME Instruction;Ultrasound;Moist Heat;Iontophoresis 8m/ml Dexamethasone;Gait training;Therapeutic activities;Therapeutic exercise;Balance training;Neuromuscular re-education;Manual techniques;Joint Manipulations;Vasopneumatic Device;Taping;Patient/family education;Dry needling;Passive range of motion    PT Next Visit Plan Assess adherence to advanced HEP, ankle stability and ankle strategies during dynamic balance to reduce fear of falling.    PT Home Exercise Plan [email or web address]    Consulted and Agree with Plan of Care Patient             Patient will benefit from skilled  therapeutic intervention in order to improve the following deficits and impairments:  Abnormal gait, Difficulty walking, Increased fascial restricitons, Pain, Improper body mechanics, Decreased strength  Visit Diagnosis: No diagnosis found.     Problem List Patient Active Problem List   Diagnosis Date Noted   BMI 38.0-38.9,adult 02/16/2019   Oral contraceptive prescribed 02/16/2019  [facility]   Choose one: Rehabilitative  Standardized Assessment or Functional Outcome Tool: See Pain Assessment  Score or Percent Disability: 3/10 numeric pain rating scale  Body Parts Treated (Select each separately):  Ankle. Overall deficits/functional limitations for body part selected: mild N/A. Overall deficits/functional limitations for body part selected: NA N/A. Overall deficits/functional limitations for body part selected: NA   AGlade Lloyd SPT 10/29/21 10:36 AM  PHYSICAL THERAPY DISCHARGE SUMMARY  Visits from Start of Care: 5  Current functional level related to goals / functional outcomes: See goals above   Remaining deficits: Status unknown.    Education / Equipment: N/A   Patient agrees to discharge. Patient goals were partially met. Patient is being discharged due to not returning since the last visit.  SGwendolyn Grant PT, DPT, ATC 12/09/21 8:37 AM  [facility] [address]Phone: [phone]  Fax:  [phone] Name: DAbbiegail LandgrenPage MRN:  [id number] Date of Birth: 02-Jun-1996

## 2021-11-18 ENCOUNTER — Telehealth: Payer: Self-pay

## 2021-11-18 ENCOUNTER — Ambulatory Visit: Payer: Medicaid Other

## 2021-11-18 NOTE — Telephone Encounter (Signed)
Called pt due to no show. Left voicemail, given clinic number to call back.

## 2022-02-10 ENCOUNTER — Ambulatory Visit (INDEPENDENT_AMBULATORY_CARE_PROVIDER_SITE_OTHER): Payer: Medicaid Other

## 2022-02-10 ENCOUNTER — Other Ambulatory Visit: Payer: Self-pay

## 2022-02-10 DIAGNOSIS — Z3201 Encounter for pregnancy test, result positive: Secondary | ICD-10-CM | POA: Diagnosis not present

## 2022-02-10 DIAGNOSIS — O219 Vomiting of pregnancy, unspecified: Secondary | ICD-10-CM

## 2022-02-10 DIAGNOSIS — R109 Unspecified abdominal pain: Secondary | ICD-10-CM

## 2022-02-10 LAB — POCT URINE PREGNANCY: Preg Test, Ur: POSITIVE — AB

## 2022-02-10 MED ORDER — DICLEGIS 10-10 MG PO TBEC: 2.0000 | DELAYED_RELEASE_TABLET | Freq: Every day | ORAL | 5 refills | Status: DC

## 2022-02-10 MED ORDER — CITRANATAL BLOOM 90-1 MG PO TABS: 1.0000 | ORAL_TABLET | Freq: Every day | ORAL | 11 refills | Status: DC

## 2022-02-10 NOTE — Progress Notes (Cosign Needed)
Ms. Surges presents today for UPT. She has no unusual complaints. LMP: 01/18/22 EDD 10/25/22. Patient isi [redacted]w[redacted]d today.    OBJECTIVE: Appears well, in no apparent distress.  OB History     Gravida  1   Para  1   Term  1   Preterm      AB      Living  1      SAB      IAB      Ectopic      Multiple  0   Live Births  1          Home UPT Result: Positive x 3  In-Office UPT result: Positive I have reviewed the patient's medical, obstetrical, social, and family histories, and medications.   ASSESSMENT: Positive pregnancy test  PLAN Prenatal care to be completed at: [facility] Patient to schedule a NEW OB Intake and NEW OB provider visit Prenatal vitamins sent to pharmacy Diclegis sent to pharmacy for nausea

## 2022-02-14 ENCOUNTER — Other Ambulatory Visit: Payer: Self-pay

## 2022-02-14 DIAGNOSIS — Z3201 Encounter for pregnancy test, result positive: Secondary | ICD-10-CM

## 2022-02-14 MED ORDER — PRENATE ESSENTIAL 18-0.6-0.4-300 MG PO CAPS: 1.0000 | ORAL_CAPSULE | Freq: Every day | ORAL | 11 refills | Status: DC

## 2022-03-21 ENCOUNTER — Ambulatory Visit (INDEPENDENT_AMBULATORY_CARE_PROVIDER_SITE_OTHER): Payer: Medicaid Other

## 2022-03-21 VITALS — BP 135/82 | HR 77 | Ht 59.0 in | Wt 170.8 lb

## 2022-03-21 DIAGNOSIS — O3680X0 Pregnancy with inconclusive fetal viability, not applicable or unspecified: Secondary | ICD-10-CM

## 2022-03-21 DIAGNOSIS — Z3481 Encounter for supervision of other normal pregnancy, first trimester: Secondary | ICD-10-CM | POA: Diagnosis not present

## 2022-03-21 DIAGNOSIS — Z3A09 9 weeks gestation of pregnancy: Secondary | ICD-10-CM

## 2022-03-21 DIAGNOSIS — Z3491 Encounter for supervision of normal pregnancy, unspecified, first trimester: Secondary | ICD-10-CM | POA: Insufficient documentation

## 2022-03-21 MED ORDER — BLOOD PRESSURE KIT DEVI: 1.0000 | 0 refills | Status: DC

## 2022-03-21 NOTE — Progress Notes (Cosign Needed)
New OB Intake ? ?I connected with  Alice Humphrey on 03/21/22 at  8:15 AM EDT by in person and verified that I am speaking with the correct person using two identifiers. Nurse is located at [facility] and pt is located at [place]. ? ?I discussed the limitations, risks, security and privacy concerns of performing an evaluation and management service by telephone and the availability of in person appointments. I also discussed with the patient that there may be a patient responsible charge related to this service. The patient expressed understanding and agreed to proceed. ? ?I explained I am completing New OB Intake today. We discussed her EDD of 10/25/22 that is based on LMP of 01/18/22. Pt is G2/P1001. I reviewed her allergies, medications, Medical/Surgical/OB history, and appropriate screenings. I informed her of [facility] services. Based on history, this is a/an  pregnancy uncomplicated.  ? ?Patient Active Problem List  ? Diagnosis Date Noted  ? BMI 38.0-38.9,adult 02/16/2019  ? Oral contraceptive prescribed 02/16/2019  ? ? ?Concerns addressed today ? ?Delivery Plans:  ?Plans to deliver at [facility].  ? ?MyChart/Babyscripts ?MyChart access verified. I explained pt will have some visits in office and some virtually. Babyscripts instructions given and order placed. Patient verifies receipt of registration text/e-mail. Account successfully created and app downloaded. ? ?Blood Pressure Cuff  ?Blood pressure cuff ordered for patient to pick-up from [employer]. Explained after first prenatal appt pt will check weekly and document in 50. ? ?Weight scale: Patient does have weight scale.  ?Anatomy US ?Explained first scheduled [place] will be around 19 weeks. Dating and viability scan performed today. ? ?Labs ?Discussed Johnsie Cancel genetic screening with patient. Would like both Panorama and Horizon drawn at new OB visit.Also if interested in genetic testing, tell patient she will need AFP 15-21 weeks to complete genetic  testing .Routine prenatal labs needed. ? ?Covid Vaccine ?Patient has not covid vaccine.  ? ?Is patient a CenteringPregnancy candidate?  Interested , will discuss with partner. "Centering Patient" indicated on sticky note ?  ?Is patient interested in [place]? No  "Interested in [employer] - Schedule next visit with CNM" on sticky note ? ?Informed patient of [facility] website  and placed link in her AVS.  ? ?Social Determinants of Health ?Food Insecurity: Patient denies food insecurity. ?WIC Referral: Patient is interested in referral to [facility].  ?Transportation: Patient denies transportation needs. ?Childcare: Discussed no children allowed at ultrasound appointments. Offered childcare services; patient declines childcare services at this time. ? ?Send link to Pregnancy Navigators ? ? ?Placed OB Box on problem list and updated ? ?First visit review ?I reviewed new OB appt with pt. I explained she will have a pelvic exam, ob bloodwork with genetic screening, and PAP smear. Explained pt will be seen by Lynnda Shields at first visit; encounter routed to appropriate provider. Explained that patient will be seen by pregnancy navigator following visit with provider. [facility] information placed in AVS.  ? ?Lucianne Lei, RN ?03/21/2022  8:23 AM  ?

## 2022-04-01 ENCOUNTER — Ambulatory Visit (INDEPENDENT_AMBULATORY_CARE_PROVIDER_SITE_OTHER): Payer: Medicaid Other | Admitting: Obstetrics and Gynecology

## 2022-04-01 ENCOUNTER — Encounter: Payer: Self-pay | Admitting: Obstetrics and Gynecology

## 2022-04-01 ENCOUNTER — Other Ambulatory Visit (HOSPITAL_COMMUNITY)
Admission: RE | Admit: 2022-04-01 | Discharge: 2022-04-01 | Disposition: A | Discharge status: Home / Self Care | Payer: Medicaid Other | Source: Ambulatory Visit | Attending: Obstetrics and Gynecology | Admitting: Obstetrics and Gynecology

## 2022-04-01 VITALS — BP 119/78 | HR 80 | Wt 170.6 lb

## 2022-04-01 DIAGNOSIS — Z3481 Encounter for supervision of other normal pregnancy, first trimester: Secondary | ICD-10-CM | POA: Diagnosis not present

## 2022-04-01 DIAGNOSIS — Z3A10 10 weeks gestation of pregnancy: Secondary | ICD-10-CM

## 2022-04-01 NOTE — Progress Notes (Signed)
? ?INITIAL PRENATAL VISIT NOTE ? ?Subjective:  ?Alice Humphrey is a 26 y.o. G2P1001 at 81w3dby LMP being seen today for her initial prenatal visit.  She has an obstetric history significant for successful vaginal delivery. She has a medical history significant for possible pre-diabetes. ? ?Patient reports no complaints.  Contractions: Not present. Vag. Bleeding: None.  Movement: Absent. Denies leaking of fluid.  ? ? ?Past Medical History:  ?Diagnosis Date  ? Medical history non-contributory   ? Pre-diabetes 2006  ? ? ?Past Surgical History:  ?Procedure Laterality Date  ? NO PAST SURGERIES    ? ? ?OB History  ?Gravida Para Term Preterm AB Living  ?'2 1 1     1  ' ?SAB IAB Ectopic Multiple Live Births  ?      0 1  ?  ?# Outcome Date GA Lbr Len/2nd Weight Sex Delivery Anes PTL Lv  ?2 Current           ?1 Term 01/19/19 369w5d3:52 / 00:36 7 lb 6.5 oz (3.36 kg) F Vag-Spont EPI  LIV  ? ? ?Social History  ? ?Socioeconomic History  ? Marital status: Single  ?  Spouse name: Not on file  ? Number of children: Not on file  ? Years of education: Not on file  ? Highest education level: Not on file  ?Occupational History  ? Not on file  ?Tobacco Use  ? Smoking status: Never  ? Smokeless tobacco: Never  ?Vaping Use  ? Vaping Use: Never used  ?Substance and Sexual Activity  ? Alcohol use: Not Currently  ? Drug use: Yes  ?  Types: Marijuana  ?  Comment: last smoked approximately 6 months ago (4/2019ish)  ? Sexual activity: Yes  ?  Partners: Male  ?  Birth control/protection: None  ?Other Topics Concern  ? Not on file  ?Social History Narrative  ? Not on file  ? ?Social Determinants of Health  ? ?Financial Resource Strain: Not on file  ?Food Insecurity: Not on file  ?Transportation Needs: Not on file  ?Physical Activity: Not on file  ?Stress: Not on file  ?Social Connections: Not on file  ? ? ?Family History  ?Problem Relation Age of Onset  ? AAA (abdominal aortic aneurysm) Mother   ? Hypertension Maternal Grandmother   ? Diabetes  Maternal Grandmother   ? Hypertension Maternal Grandfather   ? Diabetes Maternal Grandfather   ? COPD Maternal Grandfather   ? ? ? ?Current Outpatient Medications:  ?  Blood Pressure Monitoring (BLOOD PRESSURE KIT) DEVI, 1 kit by Does not apply route once a week., Disp: 1 each, Rfl: 0 ?  cetirizine (ZYRTEC) 10 MG tablet, Take 10 mg by mouth daily., Disp: , Rfl:  ?  DICLEGIS 10-10 MG TBEC, Take 2 tablets by mouth at bedtime. If symptoms persist, add one tablet in the morning and one in the afternoon, Disp: 100 tablet, Rfl: 5 ?  Prenatal 28-0.8 MG TABS, Take by mouth., Disp: , Rfl:  ?  [employer] D3 50 MCG (2000 UT) CAPS, Take 1 capsule by mouth daily. (Patient not taking: Reported on 03/21/2022), Disp: , Rfl:  ?  DENTA 5000 PLUS 1.1 % CREA dental cream, Take by mouth. (Patient not taking: Reported on 03/21/2022), Disp: , Rfl:  ?  hydrOXYzine (ATARAX/VISTARIL) 25 MG tablet, Take 25 mg by mouth 3 (three) times daily as needed. (Patient not taking: Reported on 03/21/2022), Disp: , Rfl:  ?  meloxicam (MOBIC) 15 MG tablet, Take 1  tablet (15 mg total) by mouth daily. (Patient not taking: Reported on 02/10/2022), Disp: 30 tablet, Rfl: 3 ? ?No Known Allergies ? ?Review of Systems: Negative except for what is mentioned in HPI. ? ?Objective:  ? ?Vitals:  ? 04/01/22 1456  ?BP: 119/78  ?Pulse: 80  ?Weight: 170 lb 9.6 oz (77.4 kg)  ? ? ?Fetal Status:     Movement: Absent    ? ?Physical Exam: ?BP 119/78   Pulse 80   Wt 170 lb 9.6 oz (77.4 kg)   LMP 01/18/2022   BMI 34.46 kg/m?  ?CONSTITUTIONAL: Well-developed, well-nourished female in no acute distress.  ?NEUROLOGIC: Alert and oriented to person, place, and time. Normal reflexes, muscle tone coordination. No cranial nerve deficit noted. ?PSYCHIATRIC: Normal mood and affect. Normal behavior. Normal judgment and thought content. ?SKIN: Skin is warm and dry. No rash noted. Not diaphoretic. No erythema. No pallor. ?HENT:  Normocephalic, atraumatic, External right and left ear normal.  Oropharynx is clear and moist ?EYES: Conjunctivae and EOM are normal. Pupils are equal, round, and reactive to light. No scleral icterus.  ?NECK: Normal range of motion, supple, no masses ?CARDIOVASCULAR: Normal heart rate noted, regular rhythm ?RESPIRATORY: Effort and breath sounds normal, no problems with respiration noted ?BREASTS: deferred ?ABDOMEN: Soft, nontender, nondistended, gravid. ?GU: normal appearing external female genitalia, multiparous normal appearing cervix, scant white discharge in vagina, no lesions noted, pap taken, chaperon present ?Bimanual: 10 weeks sized uterus, no adnexal tenderness or palpable lesions noted ?MUSCULOSKELETAL: Normal range of motion. ?EXT:  No edema and no tenderness. 2+ distal pulses. ? ? ?Assessment and Plan:  ?Pregnancy: G2P1001 at 79w3dby LMP c/w early ultrasound ? ?1. [redacted] weeks gestation of pregnancy ? ? ?2. Encounter for supervision of other normal pregnancy in first trimester ?Continue routine prenatal care ? ?- Hepatitis C Antibody ?- Cervicovaginal ancillary only( [facility]) ?- CBC/D/Plt+RPR+Rh+ABO+RubIgG... ?- Genetic Screening ?- Cytology - PAP ? ? ?Preterm labor symptoms and general obstetric precautions including but not limited to vaginal bleeding, contractions, leaking of fluid and fetal movement were reviewed in detail with the patient. ? ?Please refer to After Visit Summary for other counseling recommendations.  ? ?Return in about 4 weeks (around 04/29/2022) for ROB, in person. ? ?LGriffin Basil?04/01/2022 ?3:44 PM  ?

## 2022-04-01 NOTE — Progress Notes (Signed)
NOB pt in office. Pt c/o "bloating" on the left side. No other complaints at this time.  ?

## 2022-04-02 LAB — CBC/D/PLT+RPR+RH+ABO+RUBIGG...
Antibody Screen: NEGATIVE
Basophils Absolute: 0 10*3/uL (ref 0.0–0.2)
Basos: 0 %
EOS (ABSOLUTE): 0.1 10*3/uL (ref 0.0–0.4)
Eos: 1 %
HCV Ab: NONREACTIVE
HIV Screen 4th Generation wRfx: NONREACTIVE
Hematocrit: 38.1 % (ref 34.0–46.6)
Hemoglobin: 13 g/dL (ref 11.1–15.9)
Hepatitis B Surface Ag: NEGATIVE
Immature Grans (Abs): 0.1 10*3/uL (ref 0.0–0.1)
Immature Granulocytes: 1 %
Lymphocytes Absolute: 2.2 10*3/uL (ref 0.7–3.1)
Lymphs: 18 %
MCH: 31.9 pg (ref 26.6–33.0)
MCHC: 34.1 g/dL (ref 31.5–35.7)
MCV: 93 fL (ref 79–97)
Monocytes Absolute: 0.8 10*3/uL (ref 0.1–0.9)
Monocytes: 7 %
Neutrophils Absolute: 9 10*3/uL — ABNORMAL HIGH (ref 1.4–7.0)
Neutrophils: 73 %
Platelets: 311 10*3/uL (ref 150–450)
RBC: 4.08 x10E6/uL (ref 3.77–5.28)
RDW: 12.7 % (ref 11.7–15.4)
RPR Ser Ql: NONREACTIVE
Rh Factor: POSITIVE
Rubella Antibodies, IGG: 10.2 index (ref >0.99)
WBC: 12.2 10*3/uL — ABNORMAL HIGH (ref 3.4–10.8)

## 2022-04-02 LAB — HCV INTERPRETATION

## 2022-04-02 LAB — HEMOGLOBIN A1C
Est. average glucose Bld gHb Est-mCnc: 100 mg/dL
Hgb A1c MFr Bld: 5.1 % (ref 4.8–5.6)

## 2022-04-02 LAB — HEPATITIS C ANTIBODY: Hep C Virus Ab: NONREACTIVE

## 2022-04-03 LAB — CERVICOVAGINAL ANCILLARY ONLY
Chlamydia: NEGATIVE
Comment: NEGATIVE
Comment: NEGATIVE
Comment: NORMAL
Neisseria Gonorrhea: NEGATIVE
Trichomonas: NEGATIVE

## 2022-04-03 LAB — URINE CULTURE, OB REFLEX

## 2022-04-03 LAB — CULTURE, OB URINE

## 2022-04-07 ENCOUNTER — Encounter: Payer: Self-pay | Admitting: Obstetrics and Gynecology

## 2022-04-07 LAB — CYTOLOGY - PAP
Comment: NEGATIVE
Diagnosis: UNDETERMINED — AB
High risk HPV: NEGATIVE

## 2022-04-29 ENCOUNTER — Encounter: Payer: Medicaid Other | Admitting: Obstetrics and Gynecology

## 2022-10-25 ENCOUNTER — Inpatient Hospital Stay (HOSPITAL_COMMUNITY): Admit: 2022-10-25 | Payer: Self-pay

## 2023-02-02 ENCOUNTER — Ambulatory Visit (INDEPENDENT_AMBULATORY_CARE_PROVIDER_SITE_OTHER): Payer: Medicaid Other | Admitting: Podiatry

## 2023-02-02 ENCOUNTER — Encounter: Payer: Self-pay | Admitting: Podiatry

## 2023-02-02 ENCOUNTER — Ambulatory Visit (INDEPENDENT_AMBULATORY_CARE_PROVIDER_SITE_OTHER): Payer: Medicaid Other

## 2023-02-02 DIAGNOSIS — M79671 Pain in right foot: Secondary | ICD-10-CM

## 2023-02-02 DIAGNOSIS — M62462 Contracture of muscle, left lower leg: Secondary | ICD-10-CM

## 2023-02-02 DIAGNOSIS — M79672 Pain in left foot: Secondary | ICD-10-CM | POA: Diagnosis not present

## 2023-02-02 DIAGNOSIS — M62461 Contracture of muscle, right lower leg: Secondary | ICD-10-CM

## 2023-02-02 DIAGNOSIS — M722 Plantar fascial fibromatosis: Secondary | ICD-10-CM

## 2023-02-02 DIAGNOSIS — G8929 Other chronic pain: Secondary | ICD-10-CM | POA: Diagnosis not present

## 2023-02-02 MED ORDER — MELOXICAM 15 MG PO TABS
15.0000 mg | ORAL_TABLET | Freq: Every day | ORAL | 3 refills | Status: DC
Start: 2023-02-02 — End: 2024-03-15

## 2023-02-02 NOTE — Patient Instructions (Signed)
Call [facility] to schedule your MRI at the below locations.  Please allow at least 1 business day after your visit to process the referral.  It may take longer depending on approval from insurance.  Please let me know if you have issues or problems scheduling the MRI   [facility] [phone] [address] [place], [place] [address]  [facility] [phone] [address] [place], [place] [address]  

## 2023-02-05 NOTE — Progress Notes (Addendum)
  Subjective:  Patient ID: Alice Humphrey, female    DOB: [birth year],  MRN: [id number]  Chief Complaint  Patient presents with   Foot Pain    Patient is here today to discuss the bilateral heel pains she has been having x 2 months.  Sx bilateral heel pain with no swelling in the both feet. Tx- steroid medication, otc NSAIDS, foot exercises    27 y.o. female presents with the above complaint. History confirmed with patient.  Pain has returned closer to the heel now it has been quite severe.  She has been using the OTC medications, took a steroid for it as well that was prescribed by her PCP.  Has been doing exercises for the last several months she has been doing these twice daily with calf stretching foot stretching strengthening exercises and manual therapy.  Has not had much relief and is progressively worsened since injection last year in the last 18 months  Objective:  Physical Exam: warm, good capillary refill, no trophic changes or ulcerative lesions, normal DP and PT pulses, and normal sensory exam. Left Foot: point tenderness over the heel pad, point tenderness of the mid plantar fascia, and gastrocnemius equinus is noted with a positive silverskiold test Right Foot: point tenderness over the heel pad, point tenderness of the mid plantar fascia, and gastrocnemius equinus is noted with a positive silverskiold test   Radiographs: Multiple views x-ray of both feet: Small plantar calcaneal spurs bilateral Assessment:   1. Plantar fasciitis, bilateral   2. Gastrocnemius equinus of left lower extremity   3. Gastrocnemius equinus of right lower extremity      Plan:  Patient was evaluated and treated and all questions answered.  Discussed the etiology and treatment options for plantar fasciitis including stretching, formal physical therapy, supportive shoegears such as a running shoe or sneaker, pre fabricated orthoses, injection therapy, and oral medications. We also discussed the  role of surgical treatment of this for patients who do not improve after exhausting non-surgical treatment options.   Unfortunately continues to worsen has not improved despite injection and home therapy since her last visit with me 18 months ago, she has been working on these since that last visit twice a day daily.  Has not improved.  As noted above she has done home therapy including stretching strengthening exercises of the calf and foot and manual manipulation of the foot as well.  She has had poor response to this and has not improved with this.  She is interested in surgical correction at this point.  I think she has made a reasonable attempt at nonsurgical treatment and essentially exhausted her treatment options at this point.  We discussed the option of endoscopic plantar fasciotomy and gastrocnemius recession with PRP injection.  I recommend an MRI prior to proceeding with this.  Do not think she needs heel spur resection.  I will see her back after the MRI for surgical planning.  Rx meloxicam sent to pharmacy to take for now.  Return for after MRI to review.

## 2023-02-19 ENCOUNTER — Telehealth: Payer: Self-pay | Admitting: *Deleted

## 2023-02-19 ENCOUNTER — Encounter: Payer: Self-pay | Admitting: Podiatry

## 2023-02-21 NOTE — Telephone Encounter (Signed)
error 

## 2023-02-23 ENCOUNTER — Other Ambulatory Visit: Payer: Medicaid Other

## 2023-02-24 ENCOUNTER — Telehealth: Payer: Self-pay | Admitting: *Deleted

## 2023-02-24 NOTE — Telephone Encounter (Signed)
Patient is wanting status on her MRI. Called patient to explain that we are waiting on prior authorization from insurance company,verbalized understanding and said ok.

## 2023-03-04 ENCOUNTER — Ambulatory Visit: Payer: Medicaid Other | Admitting: Podiatry

## 2023-03-20 ENCOUNTER — Encounter: Payer: Self-pay | Admitting: Podiatry

## 2023-03-23 ENCOUNTER — Encounter: Payer: Self-pay | Admitting: Podiatry

## 2023-03-28 ENCOUNTER — Ambulatory Visit
Admission: RE | Admit: 2023-03-28 | Discharge: 2023-03-28 | Disposition: A | Discharge status: Home / Self Care | Payer: Medicaid Other | Source: Ambulatory Visit | Attending: Podiatry | Admitting: Podiatry

## 2023-03-28 DIAGNOSIS — M722 Plantar fascial fibromatosis: Secondary | ICD-10-CM

## 2023-04-02 ENCOUNTER — Ambulatory Visit (INDEPENDENT_AMBULATORY_CARE_PROVIDER_SITE_OTHER): Payer: Medicaid Other | Admitting: Podiatry

## 2023-04-02 DIAGNOSIS — M722 Plantar fascial fibromatosis: Secondary | ICD-10-CM | POA: Diagnosis not present

## 2023-04-02 DIAGNOSIS — M62461 Contracture of muscle, right lower leg: Secondary | ICD-10-CM | POA: Diagnosis not present

## 2023-04-03 ENCOUNTER — Telehealth: Payer: Self-pay | Admitting: Urology

## 2023-04-03 NOTE — Telephone Encounter (Signed)
DOS - 04/20/23  EPF RIGHT --- [id number] GASTROCNEMIUS RECESS RIGHT --- [id number]  [facility] MEDICAID   RECEIVED A FAX FROM [facility] MEDICAID STATING THAT CPT COD

## 2023-04-06 ENCOUNTER — Encounter: Payer: Self-pay | Admitting: Podiatry

## 2023-04-06 NOTE — Progress Notes (Signed)
  Subjective:  Patient ID: Alice Humphrey, female    DOB: 1996-08-11,  MRN: [id number]  Chief Complaint  Patient presents with   Plantar Fasciitis    Bilateral MRI results    27 y.o. female presents with the above complaint. History confirmed with patient.  She returns for follow-up pain has not really changed much, still bothering her quite a bit.  She completed the MRI  Objective:  Physical Exam: warm, good capillary refill, no trophic changes or ulcerative lesions, normal DP and PT pulses, and normal sensory exam. Left Foot: point tenderness over the heel pad, point tenderness of the mid plantar fascia, and gastrocnemius equinus is noted with a positive silverskiold test Right Foot: point tenderness over the heel pad, point tenderness of the mid plantar fascia, and gastrocnemius equinus is noted with a positive silverskiold test  MRI right foot IMPRESSION: 1. Mild edema about the origin of the plantar fascia suggesting mild plantar fasciitis. 2. Ligaments and tendons of the medial and lateral compartments are intact. 3. No evidence of fracture or osteonecrosis. 4. Mild subcutaneous soft tissue edema about the medial and lateral aspect of the ankle/foot.     Electronically Signed   By: Larose Hires D.O.   On: 03/28/2023 23:49  Radiographs: Multiple views x-ray of both feet: Small plantar calcaneal spurs bilateral Assessment:   1. Plantar fasciitis of right foot   2. Gastrocnemius equinus of right lower extremity      Plan:  Patient was evaluated and treated and all questions answered.  We again discussed her progress and treatment options.  Thus far she has exhausted all nonsurgical treatment options including physical therapy, anti-inflammatories multiple injections and immobilization and rest.  She is interested in definitive surgical correction.  Surgical we discussed endoscopic plantar fasciotomy, endoscopic gastrocnemius recession of the right side.  She will need  this for the left side but we will start the right side which is more painful.  Discussed postoperative protocol including the period of restricted weightbearing in the cam boot, we also discussed the risk benefits and potential complications including but not limited to pain, swelling, infection, scar, numbness which may be temporary or permanent, chronic pain, stiffness, nerve pain or damage, wound healing problems.  Informed consent signed and reviewed.  All questions addressed.   Surgical plan:  Procedure: -R EPF and EGR  Location: -[facility]  Anesthesia plan: -IV sedation with regional block  Postoperative pain plan: - Tylenol 1000 mg every 6 hours, ibuprofen 600 mg every 6 hours, gabapentin 300 mg every 8 hours x5 days, oxycodone 5 mg 1-2 tabs every 6 hours only as needed  DVT prophylaxis: -None required  WB Restrictions / DME needs: -WBAT in CAM boot    No follow-ups on file.

## 2023-04-16 ENCOUNTER — Telehealth: Payer: Self-pay | Admitting: Urology

## 2023-04-16 NOTE — Telephone Encounter (Signed)
DOS - 04/20/23  GASTROCNEMIUS RECESS RIGHT --- [id number] EPF RIGHT --- [id number]   [facility] MEDICAID   RECEIVED A FAX STATING THAT FOR CPT CODE 65784 NO PRIOR AUTH IS REQUIRED. FOR CPT CODE 69629 HAS BEEN APPROVED, AUTH # [id number], GOOD FROM 04/20/23 - 04/20/23.

## 2023-04-20 ENCOUNTER — Encounter: Payer: Self-pay | Admitting: Podiatry

## 2023-04-20 ENCOUNTER — Other Ambulatory Visit: Payer: Self-pay | Admitting: Podiatry

## 2023-04-20 DIAGNOSIS — M216X1 Other acquired deformities of right foot: Secondary | ICD-10-CM

## 2023-04-20 DIAGNOSIS — M722 Plantar fascial fibromatosis: Secondary | ICD-10-CM

## 2023-04-20 MED ORDER — IBUPROFEN 600 MG PO TABS: 600.0000 mg | ORAL_TABLET | Freq: Four times a day (QID) | ORAL | 0 refills | Status: AC | PRN

## 2023-04-20 MED ORDER — GABAPENTIN 300 MG PO CAPS: 300.0000 mg | ORAL_CAPSULE | Freq: Three times a day (TID) | ORAL | 0 refills | Status: DC

## 2023-04-20 MED ORDER — OXYCODONE HCL 5 MG PO TABS: 5.0000 mg | ORAL_TABLET | ORAL | 0 refills | Status: AC | PRN

## 2023-04-20 MED ORDER — ACETAMINOPHEN 500 MG PO TABS: 1000.0000 mg | ORAL_TABLET | Freq: Four times a day (QID) | ORAL | 0 refills | Status: AC | PRN

## 2023-04-20 MED ORDER — ACETAMINOPHEN 500 MG PO TABS: 1000.0000 mg | ORAL_TABLET | Freq: Four times a day (QID) | ORAL | 0 refills | Status: DC | PRN

## 2023-04-20 MED ORDER — OXYCODONE HCL 5 MG PO TABS: 5.0000 mg | ORAL_TABLET | ORAL | 0 refills | Status: DC | PRN

## 2023-04-20 MED ORDER — IBUPROFEN 600 MG PO TABS: 600.0000 mg | ORAL_TABLET | Freq: Four times a day (QID) | ORAL | 0 refills | Status: DC | PRN

## 2023-04-20 NOTE — Progress Notes (Signed)
4/29 EPF EGR

## 2023-04-23 ENCOUNTER — Encounter: Payer: Self-pay | Admitting: Podiatry

## 2023-04-28 ENCOUNTER — Ambulatory Visit (INDEPENDENT_AMBULATORY_CARE_PROVIDER_SITE_OTHER): Payer: Medicaid Other | Admitting: Podiatry

## 2023-04-28 DIAGNOSIS — M62461 Contracture of muscle, right lower leg: Secondary | ICD-10-CM

## 2023-04-28 DIAGNOSIS — M722 Plantar fascial fibromatosis: Secondary | ICD-10-CM

## 2023-04-28 MED ORDER — GABAPENTIN 300 MG PO CAPS: 300.0000 mg | ORAL_CAPSULE | Freq: Three times a day (TID) | ORAL | 0 refills | Status: DC

## 2023-05-04 ENCOUNTER — Encounter: Payer: Self-pay | Admitting: Podiatry

## 2023-05-04 NOTE — Progress Notes (Signed)
  Subjective:  Patient ID: Alice Humphrey, female    DOB: [birth year],  MRN: [id number]  Chief Complaint  Patient presents with   Post-op Follow-up    POV #1 DOS 04/20/2023 EPF & CALF MUSCLE LENTHENING RT, PATIENT STATED SHE IS DOING VERY WELL, PATIENT STATES THAT PAIN TODAY IS AT A 6 AND LOCATED MOSTLY IN THE CALF AND HEEL DUE TO WALKING WITH BOOT     27 y.o. female returns for post-op check.   Review of Systems: Negative except as noted in the HPI. Denies N/V/F/Ch.   Objective:  There were no vitals filed for this visit. There is no height or weight on file to calculate BMI. Constitutional Well developed. Well nourished.  Vascular Foot warm and well perfused. Capillary refill normal to all digits.  Calf is soft and supple, no posterior calf or knee pain, negative Homans' sign  Neurologic Normal speech. Oriented to person, place, and time. Epicritic sensation to light touch grossly present bilaterally.  Dermatologic Skin healing well without signs of infection. Skin edges well coapted without signs of infection.  Orthopedic: Tenderness to palpation noted about the surgical site.    Assessment:   1. Plantar fasciitis of right foot   2. Gastrocnemius equinus of right lower extremity    Plan:  Patient was evaluated and treated and all questions answered.  S/p foot surgery right -Progressing as expected post-operatively. -WB Status: WBAT in cam walker boot -Sutures: Return 2 weeks for removal. -Medications: Gabapentin refill sent -Foot redressed.  Return in about 2 weeks (around 05/12/2023) for suture removal.

## 2023-05-04 NOTE — Telephone Encounter (Signed)
Please see additional comment below. Thanks

## 2023-05-05 ENCOUNTER — Encounter: Payer: Self-pay | Admitting: Podiatry

## 2023-05-12 ENCOUNTER — Ambulatory Visit (INDEPENDENT_AMBULATORY_CARE_PROVIDER_SITE_OTHER): Payer: Medicaid Other | Admitting: Podiatry

## 2023-05-12 DIAGNOSIS — M62461 Contracture of muscle, right lower leg: Secondary | ICD-10-CM

## 2023-05-12 DIAGNOSIS — M722 Plantar fascial fibromatosis: Secondary | ICD-10-CM

## 2023-05-12 NOTE — Patient Instructions (Signed)
Call to schedule physical therapy:  [facility] [address]  [phone]  

## 2023-05-14 ENCOUNTER — Encounter: Payer: Self-pay | Admitting: Podiatry

## 2023-05-14 NOTE — Progress Notes (Signed)
  Subjective:  Patient ID: Alice Humphrey, female    DOB: 11-25-1996,  MRN: [id number]  Chief Complaint  Patient presents with   Routine Post Op    POV #2 DOS 04/20/2023 EPF & CALF MUSCLE LENTHENING RT     27 y.o. female returns for post-op check.  She is doing well not having much pain  Review of Systems: Negative except as noted in the HPI. Denies N/V/F/Ch.   Objective:  There were no vitals filed for this visit. There is no height or weight on file to calculate BMI. Constitutional Well developed. Well nourished.  Vascular Foot warm and well perfused. Capillary refill normal to all digits.  Calf is soft and supple, no posterior calf or knee pain, negative Homans' sign  Neurologic Normal speech. Oriented to person, place, and time. Epicritic sensation to light touch grossly present bilaterally.  Dermatologic Incisions well-healed not hypertrophic  Orthopedic: No pain to palpation    Assessment:   1. Plantar fasciitis of right foot   2. Gastrocnemius equinus of right lower extremity    Plan:  Patient was evaluated and treated and all questions answered.  S/p foot surgery right -Overall doing very well her sutures were removed she may continue to be WBAT in the cam boot.  In 1 to 2 weeks may begin to remove this and drive.  I would like her to begin physical therapy for strengthening conditioning and range of motion.  Referral was sent.  I will see her back in 3 weeks for follow-up and hopeful transition back to regular shoe gear at that point.  Return in about 3 weeks (around 06/02/2023) for post op (no x-rays).

## 2023-06-02 ENCOUNTER — Ambulatory Visit (INDEPENDENT_AMBULATORY_CARE_PROVIDER_SITE_OTHER): Payer: Medicaid Other | Admitting: Podiatry

## 2023-06-02 ENCOUNTER — Encounter: Payer: Self-pay | Admitting: Podiatry

## 2023-06-02 DIAGNOSIS — M722 Plantar fascial fibromatosis: Secondary | ICD-10-CM

## 2023-06-02 DIAGNOSIS — M62461 Contracture of muscle, right lower leg: Secondary | ICD-10-CM

## 2023-06-03 NOTE — Progress Notes (Signed)
  Subjective:  Patient ID: Alice Humphrey, female    DOB: May 28, 1996,  MRN: [id number]  Chief Complaint  Patient presents with   Routine Post Op    Pt states there is bit pain in the heel. Pt also states she has a red spot under her knee than turned into a bruise      27 y.o. female returns for post-op check.  She is doing well not having much pain  Review of Systems: Negative except as noted in the HPI. Denies N/V/F/Ch.   Objective:  There were no vitals filed for this visit. There is no height or weight on file to calculate BMI. Constitutional Well developed. Well nourished.  Vascular Foot warm and well perfused. Capillary refill normal to all digits.  Calf is soft and supple, no posterior calf or knee pain, negative Homans' sign  Neurologic Normal speech. Oriented to person, place, and time. Epicritic sensation to light touch grossly present bilaterally.  Dermatologic Incisions well-healed not hypertrophic  Orthopedic: No pain to palpation    Assessment:   1. Plantar fasciitis of right foot   2. Gastrocnemius equinus of right lower extremity    Plan:  Patient was evaluated and treated and all questions answered.  S/p foot surgery right -Can transition out of the boot and begin full weightbearing in regular shoe gear and physical therapy exercises.  Her upcoming therapy appointment scheduled for this week.  -Scheduled to go back to work next week I think she will still be on track for this I advised her to begin weightbearing in regular shoes over the next few days and to let me know if there are any issues being on her foot if she needs to extend her out of leave time  Return in about 6 weeks (around 07/14/2023) for post op (no x-rays).

## 2023-06-04 ENCOUNTER — Ambulatory Visit: Payer: Medicaid Other | Attending: Podiatry

## 2023-06-04 ENCOUNTER — Other Ambulatory Visit: Payer: Self-pay

## 2023-06-04 DIAGNOSIS — M62461 Contracture of muscle, right lower leg: Secondary | ICD-10-CM | POA: Diagnosis not present

## 2023-06-04 DIAGNOSIS — M722 Plantar fascial fibromatosis: Secondary | ICD-10-CM | POA: Diagnosis not present

## 2023-06-04 DIAGNOSIS — M6281 Muscle weakness (generalized): Secondary | ICD-10-CM | POA: Diagnosis present

## 2023-06-04 DIAGNOSIS — M79671 Pain in right foot: Secondary | ICD-10-CM | POA: Diagnosis present

## 2023-06-04 NOTE — Therapy (Signed)
OUTPATIENT PHYSICAL THERAPY LOWER EXTREMITY EVALUATION   Patient Name: Alice Humphrey MRN: [id number] DOB:04/05/1996, 27 y.o., female Today's Date: 06/04/2023  END OF SESSION:  PT End of Session - 06/04/23 1551     Visit Number 1    Number of Visits 7    PT Start Time 1538    PT Stop Time 1615    PT Time Calculation (min) 37 min    Activity Tolerance Patient tolerated treatment well;Patient limited by pain    Behavior During Therapy [facility] for tasks assessed/performed             Past Medical History:  Diagnosis Date   Medical history non-contributory    Pre-diabetes 2006   Past Surgical History:  Procedure Laterality Date   NO PAST SURGERIES     Patient Active Problem List   Diagnosis Date Noted   Supervision of normal pregnancy in first trimester 03/21/2022   BMI 38.0-38.9,adult 02/16/2019   Oral contraceptive prescribed 02/16/2019    PCP: [facility]  REFERRING PROVIDER: Edwin Cap, DPM  REFERRING DIAG: Plantar fasciitis of right foot [M72.2], Gastrocnemius equinus of right lower extremity [M62.461]   THERAPY DIAG:  Pain in right foot  Muscle weakness (generalized)  Rationale for Evaluation and Treatment: Rehabilitation  ONSET DATE: 04/20/23  SUBJECTIVE:   SUBJECTIVE STATEMENT: Patient reports that her foot pain has greatly improved since her surgery (EPF & CALF MUSCLE LENTHENING RT ), except for some heel spur pain/soreness. She reports that her greatest difficulty at this time is calf weakness.   Today is her second day after transitioning from cam boot to normal footwear. She has not returned to her normal walking and physical activity yet. She hasn't returned to work.   PERTINENT HISTORY: PMHx includes obesity   PAIN:  Are you having pain? Yes: NPRS scale: 3/10 Pain location: R heel  Pain description: dull aching Aggravating factors: prolonged weight bearing, stair climb  PRECAUTIONS: None  WEIGHT BEARING  RESTRICTIONS: No  FALLS:  Has patient fallen in last 6 months? No  LIVING ENVIRONMENT: Lives with: lives with their family and lives with their daughter Lives in: House/apartment Stairs: Yes: Internal: 15 steps; on right going up and External: 2 steps; can reach both Has following equipment at home: None  OCCUPATION: Server/Waitress  - 6 hour shifts  PLOF: Independent  PATIENT GOALS: return to PLOF, strengthen my ankle/calf  NEXT MD VISIT: none reported at time of eval  OBJECTIVE:   DIAGNOSTIC FINDINGS:   03/28/23: MR HEEL RIGHT WO CONTRAST   IMPRESSION: 1. Mild edema about the origin of the plantar fascia suggesting mild plantar fasciitis. 2. Ligaments and tendons of the medial and lateral compartments are intact. 3. No evidence of fracture or osteonecrosis. 4. Mild subcutaneous soft tissue edema about the medial and lateral aspect of the ankle/foot.  PATIENT SURVEYS:  FOTO 58 current, 39 predicted  COGNITION: Overall cognitive status: Within functional limits for tasks assessed     SENSATION: WFL   PALPATION: Mild tenderness to palpation along heel and distal gastroc/soleus   LOWER EXTREMITY ROM:  Active ROM Right eval  Ankle dorsiflexion 15  Ankle plantarflexion 45  Ankle inversion 35  Ankle eversion 15   (Blank rows = not tested)  LOWER EXTREMITY MMT:  MMT Right eval  Ankle dorsiflexion  4+  Ankle plantarflexion - standing Grade 2 (partial heel raise)  Ankle inversion 4+  Ankle eversion 4+   (Blank rows = not tested)  GAIT:  Distance walked: 100 ft Assistive device utilized: None Level of assistance: Complete Independence Comments: decreased ankle PF during terminal stance   TODAY'S TREATMENT:                                                                                                                               [facility] Adult PT Treatment:                                                DATE: 06/04/2023  Therapeutic Exercise: Standing  Heel/Toe Raises     PATIENT EDUCATION:  Education details: see home program below Person educated: Patient Education method: Explanation, Demonstration, and Handouts Education comprehension: verbalized understanding and returned demonstration  HOME EXERCISE PROGRAM: Verbal instruction  - begin bilateral heel/toe raises in standing, 2 x 10, 2x/daily   ASSESSMENT:  CLINICAL IMPRESSION: Patient is a 27 y.o. female who was seen today for physical therapy evaluation and treatment for R ankle weakness, 6 weeks s/p EPF & CALF MUSCLE LENTHENING surgery. She is demonstrating decreased R ankle strength, and diminished weight bearing tolerance. She has related pain and difficulty with standing, stair climbing, and prolonged walking. She will benefit from skilled PT services to address relevant deficits and return to PLOF.   OBJECTIVE IMPAIRMENTS: Abnormal gait, decreased activity tolerance, decreased balance, decreased endurance, decreased strength, impaired perceived functional ability, and pain.   ACTIVITY LIMITATIONS: carrying, standing, stairs, and locomotion level  PARTICIPATION LIMITATIONS: meal prep, cleaning, shopping, community activity, and occupation  PERSONAL FACTORS: Past/current experiences, Time since onset of injury/illness/exacerbation, and 1 comorbidity: Delmar Landau  are also affecting patient's functional outcome.   REHAB POTENTIAL: Good  CLINICAL DECISION MAKING: Stable/uncomplicated  EVALUATION COMPLEXITY: Low   GOALS: Goals reviewed with patient? Yes  SHORT TERM GOALS: Target date: 06/26/2023   Patient will be independent with initial home program for LE strengthening.  Baseline: provided at eval  Goal status: INITIAL  2.  Patient will demonstrate at least 2 full AROM single leg heel raises on Rt LE.  Baseline: able to perform 2 partial single leg heel raises on Rt  Goal status: INITIAL     LONG TERM GOALS: Target date: 07/17/2023  Patient will report improved  overall functional ability with FOTO score of FOTO of 68 or greater.  Baseline: 58 Goal status: INITIAL  2.  Patient will report ability to tolerate at least 45 minutes of standing and walking activities with no more than minimal foot/ankle pain.  Baseline: unable to tolerate >20 minutes of weight bearing activity without increase pain/muscle fatigue Goal status: INITIAL  3.  Patient will demonstrate ability to perform at least 10 full AROM single leg heel raises on Rt LE.  Baseline: unable  Goal status: INITIAL  4.  Patient will report ability to ascend/descend stairs without pain or difficulty.  Baseline: some pain/difficulty reported  Goal status: INITIAL    PLAN:  PT FREQUENCY: 1x/week  PT DURATION: 6 weeks  PLANNED INTERVENTIONS: Therapeutic exercises, Therapeutic activity, Neuromuscular re-education, Balance training, Gait training, Patient/Family education, Self Care, Joint mobilization, Stair training, Dry Needling, Electrical stimulation, Cryotherapy, Moist heat, Taping, Manual therapy, and Re-evaluation  PLAN FOR NEXT SESSION: begin LE strengthening program, address activity tolerance, create and provide home program     [facility] Authorization   Choose one: Rehabilitative  Standardized Assessment or Functional Outcome Tool: See Pain Assessment and Other FOTO  Score or Percent Disability: 58  Body Parts Treated (Select each separately):  Ankle. Overall deficits/functional limitations for body part selected: moderate   If treatment provided at initial evaluation, no treatment charged due to lack of authorization.     Mauri Reading, PT, DPT  06/05/2023, 3:15 PM

## 2023-06-11 ENCOUNTER — Ambulatory Visit: Payer: Medicaid Other

## 2023-06-16 ENCOUNTER — Ambulatory Visit: Payer: Medicaid Other

## 2023-06-16 DIAGNOSIS — M6281 Muscle weakness (generalized): Secondary | ICD-10-CM

## 2023-06-16 DIAGNOSIS — M79671 Pain in right foot: Secondary | ICD-10-CM | POA: Diagnosis not present

## 2023-06-16 NOTE — Therapy (Signed)
OUTPATIENT PHYSICAL THERAPY TREATMENT NOTE   Patient Name: Alice Humphrey MRN: [id number] DOB:04/01/1996, 27 y.o., female Today's Date: 06/04/2023  END OF SESSION:  PT End of Session - 06/16/23 1624     Visit Number 2    Number of Visits 7    PT Start Time 1624    PT Stop Time 1655    PT Time Calculation (min) 31 min    Activity Tolerance Patient tolerated treatment well;Patient limited by pain    Behavior During Therapy [facility] for tasks assessed/performed              Past Medical History:  Diagnosis Date   Medical history non-contributory    Pre-diabetes 2006   Past Surgical History:  Procedure Laterality Date   NO PAST SURGERIES     Patient Active Problem List   Diagnosis Date Noted   Supervision of normal pregnancy in first trimester 03/21/2022   BMI 38.0-38.9,adult 02/16/2019   Oral contraceptive prescribed 02/16/2019    PCP: [facility]  REFERRING PROVIDER: Edwin Cap, DPM  REFERRING DIAG: Plantar fasciitis of right foot [M72.2], Gastrocnemius equinus of right lower extremity [M62.461]   THERAPY DIAG:  Pain in right foot  Muscle weakness (generalized)  Rationale for Evaluation and Treatment: Rehabilitation  ONSET DATE: 04/20/23  SUBJECTIVE:   SUBJECTIVE STATEMENT: Pt presents to PT with no current reports of pain. Has been compliant with HEP with no adverse effect.   PERTINENT HISTORY: PMHx includes obesity   PAIN:  Are you having pain?  Yes: NPRS scale: 3/10 Pain location: R heel  Pain description: dull aching Aggravating factors: prolonged weight bearing, stair climb  PRECAUTIONS: None  WEIGHT BEARING RESTRICTIONS: No  FALLS:  Has patient fallen in last 6 months? No  LIVING ENVIRONMENT: Lives with: lives with their family and lives with their daughter Lives in: House/apartment Stairs: Yes: Internal: 15 steps; on right going up and External: 2 steps; can reach both Has following equipment at home:  None  OCCUPATION: Server/Waitress  - 6 hour shifts  PLOF: Independent  PATIENT GOALS: return to PLOF, strengthen my ankle/calf  NEXT MD VISIT: none reported at time of eval  OBJECTIVE:   DIAGNOSTIC FINDINGS:   03/28/23: MR HEEL RIGHT WO CONTRAST   IMPRESSION: 1. Mild edema about the origin of the plantar fascia suggesting mild plantar fasciitis. 2. Ligaments and tendons of the medial and lateral compartments are intact. 3. No evidence of fracture or osteonecrosis. 4. Mild subcutaneous soft tissue edema about the medial and lateral aspect of the ankle/foot.  PATIENT SURVEYS:  FOTO 58 current, 53 predicted  COGNITION: Overall cognitive status: Within functional limits for tasks assessed     SENSATION: WFL   PALPATION: Mild tenderness to palpation along heel and distal gastroc/soleus   LOWER EXTREMITY ROM:  Active ROM Right eval  Ankle dorsiflexion 15  Ankle plantarflexion 45  Ankle inversion 35  Ankle eversion 15   (Blank rows = not tested)  LOWER EXTREMITY MMT:  MMT Right eval  Ankle dorsiflexion  4+  Ankle plantarflexion - standing Grade 2 (partial heel raise)  Ankle inversion 4+  Ankle eversion 4+   (Blank rows = not tested)  GAIT: Distance walked: 100 ft Assistive device utilized: None Level of assistance: Complete Independence Comments: decreased ankle PF during terminal stance   TODAY'S TREATMENT:  TREATMENT: [facility] Adult PT Treatment:                                                DATE: 06/16/2023 Therapeutic Exercise: NuStep lvl 5 LE only x 3 min while taking subjective Standing heel toe raises x 15 Slant board calf stretch x 30" Tandem on foam 2x30" each Step ups 8in R leading 2x10 - 1 UE support R ankle DF/inv/ev 2x10 RTB R LE seated calf raise 15# DB x 15  Seated heel raise with ball 2x10  [facility] Adult PT Treatment:                                                 DATE: 06/04/2023  Therapeutic Exercise: Standing Heel/Toe Raises  PATIENT EDUCATION:  Education details: see home program below Person educated: Patient Education method: Explanation, Demonstration, and Handouts Education comprehension: verbalized understanding and returned demonstration  HOME EXERCISE PROGRAM: Access Code: [email or web address] URL: [email or web address] Date: 06/16/2023 Prepared by: Edwinna Areola  Exercises - Heel Toe Raises with Counter Support  - 1 x daily - 7 x weekly - 3 sets - 10 reps - Standing Tandem Balance with Counter Support  - 1 x daily - 7 x weekly - 3 sets - 30 sec hold - Ankle Dorsiflexion with Resistance  - 1 x daily - 7 x weekly - 3 sets - 10 reps - Seated Ankle Inversion with Resistance and Legs Crossed  - 1 x daily - 7 x weekly - 3 sets - 10 reps - red band hold - Long Sitting Ankle Eversion with Resistance  - 1 x daily - 7 x weekly - 3 sets - 10 reps - red band hold - Seated Calf Raise With Small Ball at Heels  - 1 x daily - 7 x weekly - 3 sets - 10 reps  ASSESSMENT:  CLINICAL IMPRESSION: Pt able to complete all prescribed exercises with focus on distal LE strengthening and ankle proprioception. HEP updated for continued progression. Pt progressing as expected with therapy, will continue per POC.   OBJECTIVE IMPAIRMENTS: Abnormal gait, decreased activity tolerance, decreased balance, decreased endurance, decreased strength, impaired perceived functional ability, and pain.   ACTIVITY LIMITATIONS: carrying, standing, stairs, and locomotion level  PARTICIPATION LIMITATIONS: meal prep, cleaning, shopping, community activity, and occupation  PERSONAL FACTORS: Past/current experiences, Time since onset of injury/illness/exacerbation, and 1 comorbidity: Delmar Landau  are also affecting patient's functional outcome.   REHAB POTENTIAL: Good  CLINICAL DECISION MAKING: Stable/uncomplicated  EVALUATION  COMPLEXITY: Low   GOALS: Goals reviewed with patient? Yes  SHORT TERM GOALS: Target date: 06/26/2023   Patient will be independent with initial home program for LE strengthening.  Baseline: provided at eval  Goal status: INITIAL  2.  Patient will demonstrate at least 2 full AROM single leg heel raises on Rt LE.  Baseline: able to perform 2 partial single leg heel raises on Rt  Goal status: INITIAL     LONG TERM GOALS: Target date: 07/17/2023  Patient will report improved overall functional ability with FOTO score of FOTO of 68 or greater.  Baseline: 58 Goal status: INITIAL  2.  Patient will report ability to tolerate at least 45 minutes of standing and walking activities with  no more than minimal foot/ankle pain.  Baseline: unable to tolerate >20 minutes of weight bearing activity without increase pain/muscle fatigue Goal status: INITIAL  3.  Patient will demonstrate ability to perform at least 10 full AROM single leg heel raises on Rt LE.  Baseline: unable  Goal status: INITIAL  4.  Patient will report ability to ascend/descend stairs without pain or difficulty.  Baseline: some pain/difficulty reported  Goal status: INITIAL    PLAN:  PT FREQUENCY: 1x/week  PT DURATION: 6 weeks  PLANNED INTERVENTIONS: Therapeutic exercises, Therapeutic activity, Neuromuscular re-education, Balance training, Gait training, Patient/Family education, Self Care, Joint mobilization, Stair training, Dry Needling, Electrical stimulation, Cryotherapy, Moist heat, Taping, Manual therapy, and Re-evaluation  PLAN FOR NEXT SESSION: begin LE strengthening program, address activity tolerance, create and provide home program     [facility] Authorization   Choose one: Rehabilitative  Standardized Assessment or Functional Outcome Tool: See Pain Assessment and Other FOTO  Score or Percent Disability: 58  Body Parts Treated (Select each separately):  Ankle. Overall deficits/functional limitations  for body part selected: moderate   If treatment provided at initial evaluation, no treatment charged due to lack of authorization.     Eloy End, PT, DPT  06/17/2023, 8:24 AM

## 2023-06-24 ENCOUNTER — Ambulatory Visit: Payer: Medicaid Other

## 2023-06-30 ENCOUNTER — Ambulatory Visit: Payer: Medicaid Other | Attending: Podiatry

## 2023-06-30 DIAGNOSIS — M79672 Pain in left foot: Secondary | ICD-10-CM | POA: Insufficient documentation

## 2023-06-30 DIAGNOSIS — M6281 Muscle weakness (generalized): Secondary | ICD-10-CM | POA: Insufficient documentation

## 2023-06-30 DIAGNOSIS — M79671 Pain in right foot: Secondary | ICD-10-CM | POA: Insufficient documentation

## 2023-06-30 NOTE — Therapy (Signed)
OUTPATIENT PHYSICAL THERAPY TREATMENT NOTE   Patient Name: Alice Humphrey MRN: [id number] DOB:September 14, 1996, 27 y.o., female Today's Date: 06/04/2023  END OF SESSION:  PT End of Session - 06/30/23 1617     Visit Number 3    Number of Visits 7    PT Start Time 1615    PT Stop Time 1653    PT Time Calculation (min) 38 min    Activity Tolerance Patient tolerated treatment well;Patient limited by pain    Behavior During Therapy [facility] for tasks assessed/performed               Past Medical History:  Diagnosis Date   Medical history non-contributory    Pre-diabetes 2006   Past Surgical History:  Procedure Laterality Date   NO PAST SURGERIES     Patient Active Problem List   Diagnosis Date Noted   Supervision of normal pregnancy in first trimester 03/21/2022   BMI 38.0-38.9,adult 02/16/2019   Oral contraceptive prescribed 02/16/2019    PCP: [facility]  REFERRING PROVIDER: Edwin Cap, DPM  REFERRING DIAG: Plantar fasciitis of right foot [M72.2], Gastrocnemius equinus of right lower extremity [M62.461]   THERAPY DIAG:  Pain in right foot  Muscle weakness (generalized)  Rationale for Evaluation and Treatment: Rehabilitation  ONSET DATE: 04/20/23  SUBJECTIVE:   SUBJECTIVE STATEMENT: Patient reporting that she has been having what she feels is shin splint, under her arch that radiates to the front of her ankle. "[place] [facility] [place], maybe that's why."   PERTINENT HISTORY: PMHx includes obesity   PAIN:  Are you having pain?  Yes: NPRS scale: 3/10 Pain location: R heel  Pain description: dull aching Aggravating factors: prolonged weight bearing, stair climb  PRECAUTIONS: None  WEIGHT BEARING RESTRICTIONS: No  FALLS:  Has patient fallen in last 6 months? No  LIVING ENVIRONMENT: Lives with: lives with their family and lives with their daughter Lives in: House/apartment Stairs: Yes:  Internal: 15 steps; on right going up and External: 2 steps; can reach both Has following equipment at home: None  OCCUPATION: Server/Waitress  - 6 hour shifts  PLOF: Independent  PATIENT GOALS: return to PLOF, strengthen my ankle/calf  NEXT MD VISIT: none reported at time of eval  OBJECTIVE:   DIAGNOSTIC FINDINGS:   03/28/23: MR HEEL RIGHT WO CONTRAST   IMPRESSION: 1. Mild edema about the origin of the plantar fascia suggesting mild plantar fasciitis. 2. Ligaments and tendons of the medial and lateral compartments are intact. 3. No evidence of fracture or osteonecrosis. 4. Mild subcutaneous soft tissue edema about the medial and lateral aspect of the ankle/foot.  PATIENT SURVEYS:  FOTO 58 current, 70 predicted  COGNITION: Overall cognitive status: Within functional limits for tasks assessed     SENSATION: WFL   PALPATION: Mild tenderness to palpation along heel and distal gastroc/soleus   LOWER EXTREMITY ROM:  Active ROM Right eval  Ankle dorsiflexion 15  Ankle plantarflexion 45  Ankle inversion 35  Ankle eversion 15   (Blank rows = not tested)  LOWER EXTREMITY MMT:  MMT Right eval  Ankle dorsiflexion  4+  Ankle plantarflexion - standing Grade 2 (partial heel raise)  Ankle inversion 4+  Ankle eversion 4+   (Blank rows = not tested)  GAIT: Distance walked: 100 ft Assistive device utilized: None Level of assistance: Complete Independence Comments: decreased ankle PF during terminal stance   TODAY'S TREATMENT:  [facility] Adult PT Treatment:                                                DATE: 06/30/2023  Therapeutic Exercise: NuStep lvl 4 LE only x 5 min while taking subjective Standing heel toe raises x 20 Slant board calf stretch x 30" Tandem on foam 2x30" each Step ups 8in R leading 2x10 - 1 UE support R LE seated calf raise  10# DB, 2 x 15  Seated heel raise with ball 2x10 Seated Ankle Alphabet x 1   Manual Therapy:  STM along anterior tib, medial longitudinal arch Manual Ant. Tib stretch   [facility] Adult PT Treatment:                                                DATE: 06/16/2023 Therapeutic Exercise: NuStep lvl 5 LE only x 3 min while taking subjective Standing heel toe raises x 15 Slant board calf stretch x 30" Tandem on foam 2x30" each Step ups 8in R leading 2x10 - 1 UE support R ankle DF/inv/ev 2x10 RTB R LE seated calf raise 15# DB x 15  Seated heel raise with ball 2x10  [facility] Adult PT Treatment:                                                DATE: 06/04/2023  Therapeutic Exercise: Standing Heel/Toe Raises  PATIENT EDUCATION:  Education details: see home program below Person educated: Patient Education method: Explanation, Demonstration, and Handouts Education comprehension: verbalized understanding and returned demonstration  HOME EXERCISE PROGRAM: Access Code: [email or web address] URL: [email or web address] Date: 06/16/2023 Prepared by: Alice Humphrey  Exercises - Heel Toe Raises with Counter Support  - 1 x daily - 7 x weekly - 3 sets - 10 reps - Standing Tandem Balance with Counter Support  - 1 x daily - 7 x weekly - 3 sets - 30 sec hold - Ankle Dorsiflexion with Resistance  - 1 x daily - 7 x weekly - 3 sets - 10 reps - Seated Ankle Inversion with Resistance and Legs Crossed  - 1 x daily - 7 x weekly - 3 sets - 10 reps - red band hold - Long Sitting Ankle Eversion with Resistance  - 1 x daily - 7 x weekly - 3 sets - 10 reps - red band hold - Seated Calf Raise With Small Ball at Heels  - 1 x daily - 7 x weekly - 3 sets - 10 reps  ASSESSMENT:  CLINICAL IMPRESSION: Alice Humphrey continues to do well throughout today's session. Held on ankle strengthening activities to address recent increased pain along ant. Tib and medial arch. She responded well to manual therapy and initial ankle alphabet at  end of session. We will continue with exercise progressions as able.    OBJECTIVE IMPAIRMENTS: Abnormal gait, decreased activity tolerance, decreased balance, decreased endurance, decreased strength, impaired perceived functional ability, and pain.   ACTIVITY LIMITATIONS: carrying, standing, stairs, and locomotion level  PARTICIPATION LIMITATIONS: meal prep, cleaning, shopping, community activity, and occupation  PERSONAL FACTORS: Past/current experiences, Time since onset of injury/illness/exacerbation,  and 1 comorbidity: Delmar Landau  are also affecting patient's functional outcome.   REHAB POTENTIAL: Good  CLINICAL DECISION MAKING: Stable/uncomplicated  EVALUATION COMPLEXITY: Low   GOALS: Goals reviewed with patient? Yes  SHORT TERM GOALS: Target date: 06/26/2023   Patient will be independent with initial home program for LE strengthening.  Baseline: provided at eval  Goal status: INITIAL  2.  Patient will demonstrate at least 2 full AROM single leg heel raises on Rt LE.  Baseline: able to perform 2 partial single leg heel raises on Rt  Goal status: INITIAL     LONG TERM GOALS: Target date: 07/17/2023  Patient will report improved overall functional ability with FOTO score of FOTO of 68 or greater.  Baseline: 58 Goal status: INITIAL  2.  Patient will report ability to tolerate at least 45 minutes of standing and walking activities with no more than minimal foot/ankle pain.  Baseline: unable to tolerate >20 minutes of weight bearing activity without increase pain/muscle fatigue Goal status: INITIAL  3.  Patient will demonstrate ability to perform at least 10 full AROM single leg heel raises on Rt LE.  Baseline: unable  Goal status: INITIAL  4.  Patient will report ability to ascend/descend stairs without pain or difficulty.  Baseline: some pain/difficulty reported  Goal status: INITIAL    PLAN:  PT FREQUENCY: 1x/week  PT DURATION: 6 weeks  PLANNED INTERVENTIONS:  Therapeutic exercises, Therapeutic activity, Neuromuscular re-education, Balance training, Gait training, Patient/Family education, Self Care, Joint mobilization, Stair training, Dry Needling, Electrical stimulation, Cryotherapy, Moist heat, Taping, Manual therapy, and Re-evaluation  PLAN FOR NEXT SESSION: begin LE strengthening program, address activity tolerance, create and provide home program     [facility] Authorization   Choose one: Rehabilitative  Standardized Assessment or Functional Outcome Tool: See Pain Assessment and Other FOTO  Score or Percent Disability: 58  Body Parts Treated (Select each separately):  Ankle. Overall deficits/functional limitations for body part selected: moderate   If treatment provided at initial evaluation, no treatment charged due to lack of authorization.     Mauri Reading, PT, DPT  06/30/2023, 5:22 PM

## 2023-07-06 NOTE — Therapy (Signed)
OUTPATIENT PHYSICAL THERAPY TREATMENT NOTE   Patient Name: Alice Humphrey MRN: [id number] DOB:July 30, 1996, 27 y.o., female Today's Date: 06/04/2023  END OF SESSION:      Past Medical History:  Diagnosis Date   Medical history non-contributory    Pre-diabetes 2006   Past Surgical History:  Procedure Laterality Date   NO PAST SURGERIES     Patient Active Problem List   Diagnosis Date Noted   Supervision of normal pregnancy in first trimester 03/21/2022   BMI 38.0-38.9,adult 02/16/2019   Oral contraceptive prescribed 02/16/2019    PCP: [facility]  REFERRING PROVIDER: Edwin Cap, DPM  REFERRING DIAG: Plantar fasciitis of right foot [M72.2], Gastrocnemius equinus of right lower extremity [M62.461]   THERAPY DIAG:  No diagnosis found.  Rationale for Evaluation and Treatment: Rehabilitation  ONSET DATE: 04/20/23  SUBJECTIVE:   SUBJECTIVE STATEMENT: Patient reporting that she has been having what she feels is shin splint, under her arch that radiates to the front of her ankle. "[place] [facility] [place] [facility] [place], maybe that's why."   PERTINENT HISTORY: PMHx includes obesity   PAIN:  Are you having pain?  Yes: NPRS scale: 3/10 Pain location: R heel  Pain description: dull aching Aggravating factors: prolonged weight bearing, stair climb  PRECAUTIONS: None  WEIGHT BEARING RESTRICTIONS: No  FALLS:  Has patient fallen in last 6 months? No  LIVING ENVIRONMENT: Lives with: lives with their family and lives with their daughter Lives in: House/apartment Stairs: Yes: Internal: 15 steps; on right going up and External: 2 steps; can reach both Has following equipment at home: None  OCCUPATION: Server/Waitress  - 6 hour shifts  PLOF: Independent  PATIENT GOALS: return to PLOF, strengthen my ankle/calf  NEXT MD VISIT: none reported at time of eval  OBJECTIVE:   DIAGNOSTIC FINDINGS:   03/28/23: MR HEEL  RIGHT WO CONTRAST   IMPRESSION: 1. Mild edema about the origin of the plantar fascia suggesting mild plantar fasciitis. 2. Ligaments and tendons of the medial and lateral compartments are intact. 3. No evidence of fracture or osteonecrosis. 4. Mild subcutaneous soft tissue edema about the medial and lateral aspect of the ankle/foot.  PATIENT SURVEYS:  FOTO 58 current, 58 predicted  COGNITION: Overall cognitive status: Within functional limits for tasks assessed     SENSATION: WFL   PALPATION: Mild tenderness to palpation along heel and distal gastroc/soleus   LOWER EXTREMITY ROM:  Active ROM Right eval  Ankle dorsiflexion 15  Ankle plantarflexion 45  Ankle inversion 35  Ankle eversion 15   (Blank rows = not tested)  LOWER EXTREMITY MMT:  MMT Right eval  Ankle dorsiflexion  4+  Ankle plantarflexion - standing Grade 2 (partial heel raise)  Ankle inversion 4+  Ankle eversion 4+   (Blank rows = not tested)  GAIT: Distance walked: 100 ft Assistive device utilized: None Level of assistance: Complete Independence Comments: decreased ankle PF during terminal stance   TODAY'S TREATMENT:  [facility] Adult PT Treatment:                                                DATE: 07/07/2023 Therapeutic Exercise: NuStep lvl 4 LE only x 5 min while taking subjective Standing heel toe raises x 20 Slant board calf stretch x 30" Tandem on foam 2x30" each Step ups 8in R leading 2x10 - 1 UE support R LE seated calf raise 10# DB, 2 x 15  Seated heel raise with ball 2x10 Seated Ankle Alphabet x 1  Manual Therapy:  STM along anterior tib, medial longitudinal arch Manual Ant. Tib stretch   [facility] Adult PT Treatment:                                                DATE: 06/30/2023 Therapeutic Exercise: NuStep lvl 4 LE only x 5 min while taking subjective Standing heel toe  raises x 20 Slant board calf stretch x 30" Tandem on foam 2x30" each Step ups 8in R leading 2x10 - 1 UE support R LE seated calf raise 10# DB, 2 x 15  Seated heel raise with ball 2x10 Seated Ankle Alphabet x 1  Manual Therapy:  STM along anterior tib, medial longitudinal arch Manual Ant. Tib stretch   [facility] Adult PT Treatment:                                                DATE: 06/16/2023 Therapeutic Exercise: NuStep lvl 5 LE only x 3 min while taking subjective Standing heel toe raises x 15 Slant board calf stretch x 30" Tandem on foam 2x30" each Step ups 8in R leading 2x10 - 1 UE support R ankle DF/inv/ev 2x10 RTB R LE seated calf raise 15# DB x 15  Seated heel raise with ball 2x10  PATIENT EDUCATION:  Education details: see home program below Person educated: Patient Education method: Explanation, Demonstration, and Handouts Education comprehension: verbalized understanding and returned demonstration  HOME EXERCISE PROGRAM: Access Code: [email or web address] URL: [email or web address] Date: 06/16/2023 Prepared by: Edwinna Areola  Exercises - Heel Toe Raises with Counter Support  - 1 x daily - 7 x weekly - 3 sets - 10 reps - Standing Tandem Balance with Counter Support  - 1 x daily - 7 x weekly - 3 sets - 30 sec hold - Ankle Dorsiflexion with Resistance  - 1 x daily - 7 x weekly - 3 sets - 10 reps - Seated Ankle Inversion with Resistance and Legs Crossed  - 1 x daily - 7 x weekly - 3 sets - 10 reps - red band hold - Long Sitting Ankle Eversion with Resistance  - 1 x daily - 7 x weekly - 3 sets - 10 reps - red band hold - Seated Calf Raise With Small Ball at Heels  - 1 x daily - 7 x weekly - 3 sets - 10 reps  ASSESSMENT:  CLINICAL IMPRESSION: ***  OBJECTIVE IMPAIRMENTS: Abnormal gait, decreased activity tolerance, decreased balance, decreased endurance, decreased strength, impaired perceived functional ability, and pain.   ACTIVITY LIMITATIONS: carrying, standing,  stairs, and  locomotion level  PARTICIPATION LIMITATIONS: meal prep, cleaning, shopping, community activity, and occupation  PERSONAL FACTORS: Past/current experiences, Time since onset of injury/illness/exacerbation, and 1 comorbidity: Delmar Landau  are also affecting patient's functional outcome.    GOALS: Goals reviewed with patient? Yes  SHORT TERM GOALS: Target date: 06/26/2023   Patient will be independent with initial home program for LE strengthening.  Baseline: provided at eval  Goal status: INITIAL  2.  Patient will demonstrate at least 2 full AROM single leg heel raises on Rt LE.  Baseline: able to perform 2 partial single leg heel raises on Rt  Goal status: INITIAL     LONG TERM GOALS: Target date: 07/17/2023  Patient will report improved overall functional ability with FOTO score of FOTO of 68 or greater.  Baseline: 58 Goal status: INITIAL  2.  Patient will report ability to tolerate at least 45 minutes of standing and walking activities with no more than minimal foot/ankle pain.  Baseline: unable to tolerate >20 minutes of weight bearing activity without increase pain/muscle fatigue Goal status: INITIAL  3.  Patient will demonstrate ability to perform at least 10 full AROM single leg heel raises on Rt LE.  Baseline: unable  Goal status: INITIAL  4.  Patient will report ability to ascend/descend stairs without pain or difficulty.  Baseline: some pain/difficulty reported  Goal status: INITIAL    PLAN:  PT FREQUENCY: 1x/week  PT DURATION: 6 weeks  PLANNED INTERVENTIONS: Therapeutic exercises, Therapeutic activity, Neuromuscular re-education, Balance training, Gait training, Patient/Family education, Self Care, Joint mobilization, Stair training, Dry Needling, Electrical stimulation, Cryotherapy, Moist heat, Taping, Manual therapy, and Re-evaluation  PLAN FOR NEXT SESSION: begin LE strengthening program, address activity tolerance, create and provide home program    Eloy End, PT, DPT  07/06/2023, 1:35 PM

## 2023-07-07 ENCOUNTER — Ambulatory Visit: Payer: Medicaid Other

## 2023-07-07 DIAGNOSIS — M79671 Pain in right foot: Secondary | ICD-10-CM | POA: Diagnosis not present

## 2023-07-07 DIAGNOSIS — M79672 Pain in left foot: Secondary | ICD-10-CM

## 2023-07-07 DIAGNOSIS — M6281 Muscle weakness (generalized): Secondary | ICD-10-CM

## 2023-07-08 ENCOUNTER — Ambulatory Visit: Payer: Medicaid Other

## 2023-07-08 DIAGNOSIS — M79671 Pain in right foot: Secondary | ICD-10-CM

## 2023-07-08 DIAGNOSIS — M6281 Muscle weakness (generalized): Secondary | ICD-10-CM

## 2023-07-08 NOTE — Therapy (Addendum)
 OUTPATIENT PHYSICAL THERAPY TREATMENT NOTE/DISCHARGE  PHYSICAL THERAPY DISCHARGE SUMMARY  Visits from Start of Care: 5  Current functional level related to goals / functional outcomes: See goals/objective   Remaining deficits: Unable to assess   Education / Equipment: HEP   Patient agrees to discharge. Patient goals were unable to assess. Patient is being discharged due to not returning since the last visit.     Patient Name: Alice Humphrey MRN: [id number] DOB:04-20-1996, 27 y.o., female Today's Date: 06/04/2023  END OF SESSION:  PT End of Session - 07/08/23 1707     Visit Number 5    Number of Visits 7    PT Start Time 1703    PT Stop Time 1742    PT Time Calculation (min) 39 min    Activity Tolerance Patient tolerated treatment well;Patient limited by pain    Behavior During Therapy [facility] for tasks assessed/performed                 Past Medical History:  Diagnosis Date   Medical history non-contributory    Pre-diabetes 2006   Past Surgical History:  Procedure Laterality Date   NO PAST SURGERIES     Patient Active Problem List   Diagnosis Date Noted   Supervision of normal pregnancy in first trimester 03/21/2022   BMI 38.0-38.9,adult 02/16/2019   Oral contraceptive prescribed 02/16/2019    PCP: [facility]  REFERRING PROVIDER: Edwin Cap, DPM  REFERRING DIAG: Plantar fasciitis of right foot [M72.2], Gastrocnemius equinus of right lower extremity [M62.461]   THERAPY DIAG:  Pain in right foot  Muscle weakness (generalized)  Rationale for Evaluation and Treatment: Rehabilitation  ONSET DATE: 04/20/23  SUBJECTIVE:   SUBJECTIVE STATEMENT: Pt presetns to PT with reports of slight R foot pain, overall is doing well. Has been compliant with HEP with no adverse effect.   PERTINENT HISTORY: PMHx includes obesity   PAIN:  Are you having pain?  Yes: NPRS scale: 2/10 Pain location: R heel  Pain description: dull  aching Aggravating factors: prolonged weight bearing, stair climb  PRECAUTIONS: None  WEIGHT BEARING RESTRICTIONS: No  FALLS:  Has patient fallen in last 6 months? No  LIVING ENVIRONMENT: Lives with: lives with their family and lives with their daughter Lives in: House/apartment Stairs: Yes: Internal: 15 steps; on right going up and External: 2 steps; can reach both Has following equipment at home: None  OCCUPATION: Server/Waitress  - 6 hour shifts  PLOF: Independent  PATIENT GOALS: return to PLOF, strengthen my ankle/calf  NEXT MD VISIT: none reported at time of eval  OBJECTIVE:   DIAGNOSTIC FINDINGS:   03/28/23: MR HEEL RIGHT WO CONTRAST   IMPRESSION: 1. Mild edema about the origin of the plantar fascia suggesting mild plantar fasciitis. 2. Ligaments and tendons of the medial and lateral compartments are intact. 3. No evidence of fracture or osteonecrosis. 4. Mild subcutaneous soft tissue edema about the medial and lateral aspect of the ankle/foot.  PATIENT SURVEYS:  FOTO 58 current, 32 predicted  COGNITION: Overall cognitive status: Within functional limits for tasks assessed     SENSATION: WFL   PALPATION: Mild tenderness to palpation along heel and distal gastroc/soleus   LOWER EXTREMITY ROM:  Active ROM Right eval  Ankle dorsiflexion 15  Ankle plantarflexion 45  Ankle inversion 35  Ankle eversion 15   (Blank rows = not tested)  LOWER EXTREMITY MMT:  MMT Right eval  Ankle dorsiflexion  4+  Ankle plantarflexion - standing  Grade 2 (partial heel raise)  Ankle inversion 4+  Ankle eversion 4+   (Blank rows = not tested)  GAIT: Distance walked: 100 ft Assistive device utilized: None Level of assistance: Complete Independence Comments: decreased ankle PF during terminal stance   TODAY'S TREATMENT:                                                                                                                              [facility] Adult PT Treatment:                                                 DATE: 07/08/2023 Therapeutic Exercise: NuStep lvl 5 LE only x 3 min while taking subjective Slant board calf stretch 2x30" Heel raise with ball 2x15 Tandem on foam 2x30" each SLS on foam 2x30" R Wobble board 2x10 fwd/bwd  Tandem walk x 3 laps on foam beam Step ups 8in x 10 each - no UE support Kickstand ankle switch 2x10 5# KB R ankle 4-way 2x10 GTB  [facility] Adult PT Treatment:                                                DATE: 07/07/2023 Therapeutic Exercise: NuStep lvl 5 LE only x 3 min while taking subjective Standing heel toe raises x20 Slant board calf stretch 2x30" Tandem on foam 2x30" each SLS x 30" R Tandem walk x 3 laps on foam beam Step ups 8in x 10 each - no UE support Step up lateral x 5 8in step R ankle 4-way 2x10 GTB Seated heel raise 2x15 - 25# KB  [facility] Adult PT Treatment:                                                DATE: 06/30/2023 Therapeutic Exercise: NuStep lvl 4 LE only x 5 min while taking subjective Standing heel toe raises x 20 Slant board calf stretch x 30" Tandem on foam 2x30" each Step ups 8in R leading 2x10 - 1 UE support R LE seated calf raise 10# DB, 2 x 15  Seated heel raise with ball 2x10 Seated Ankle Alphabet x 1  Manual Therapy:  STM along anterior tib, medial longitudinal arch Manual Ant. Tib stretch   PATIENT EDUCATION:  Education details: see home program below Person educated: Patient Education method: Explanation, Demonstration, and Handouts Education comprehension: verbalized understanding and returned demonstration  HOME EXERCISE PROGRAM: Access Code: [email or web address] URL: [email or web address] Date: 06/16/2023 Prepared by: Edwinna Areola  Exercises - Heel Toe Raises with Counter Support  - 1  x daily - 7 x weekly - 3 sets - 10 reps - Standing Tandem Balance with Counter Support  - 1 x daily - 7 x weekly - 3 sets - 30 sec hold - Ankle Dorsiflexion with Resistance  - 1 x daily  - 7 x weekly - 3 sets - 10 reps - Seated Ankle Inversion with Resistance and Legs Crossed  - 1 x daily - 7 x weekly - 3 sets - 10 reps - red band hold - Long Sitting Ankle Eversion with Resistance  - 1 x daily - 7 x weekly - 3 sets - 10 reps - red band hold - Seated Calf Raise With Small Ball at Heels  - 1 x daily - 7 x weekly - 3 sets - 10 reps  ASSESSMENT:  CLINICAL IMPRESSION: Pt able to complete all prescribed exercises with focus on distal LE strengthening and ankle proprioception. Pt progressing as expected with therapy, will continue per POC.   OBJECTIVE IMPAIRMENTS: Abnormal gait, decreased activity tolerance, decreased balance, decreased endurance, decreased strength, impaired perceived functional ability, and pain.   ACTIVITY LIMITATIONS: carrying, standing, stairs, and locomotion level  PARTICIPATION LIMITATIONS: meal prep, cleaning, shopping, community activity, and occupation  PERSONAL FACTORS: Past/current experiences, Time since onset of injury/illness/exacerbation, and 1 comorbidity: Delmar Landau  are also affecting patient's functional outcome.    GOALS: Goals reviewed with patient? Yes  SHORT TERM GOALS: Target date: 06/26/2023   Patient will be independent with initial home program for LE strengthening.  Baseline: provided at eval  Goal status: INITIAL  2.  Patient will demonstrate at least 2 full AROM single leg heel raises on Rt LE.  Baseline: able to perform 2 partial single leg heel raises on Rt  Goal status: INITIAL     LONG TERM GOALS: Target date: 07/17/2023  Patient will report improved overall functional ability with FOTO score of FOTO of 68 or greater.  Baseline: 58 Goal status: INITIAL  2.  Patient will report ability to tolerate at least 45 minutes of standing and walking activities with no more than minimal foot/ankle pain.  Baseline: unable to tolerate >20 minutes of weight bearing activity without increase pain/muscle fatigue Goal status:  INITIAL  3.  Patient will demonstrate ability to perform at least 10 full AROM single leg heel raises on Rt LE.  Baseline: unable  Goal status: INITIAL  4.  Patient will report ability to ascend/descend stairs without pain or difficulty.  Baseline: some pain/difficulty reported  Goal status: INITIAL    PLAN:  PT FREQUENCY: 1x/week  PT DURATION: 6 weeks  PLANNED INTERVENTIONS: Therapeutic exercises, Therapeutic activity, Neuromuscular re-education, Balance training, Gait training, Patient/Family education, Self Care, Joint mobilization, Stair training, Dry Needling, Electrical stimulation, Cryotherapy, Moist heat, Taping, Manual therapy, and Re-evaluation  PLAN FOR NEXT SESSION: begin LE strengthening program, address activity tolerance, create and provide home program   Eloy End, PT, DPT  07/09/2023, 8:28 AM

## 2023-07-15 ENCOUNTER — Ambulatory Visit: Payer: Medicaid Other

## 2023-07-20 ENCOUNTER — Encounter: Payer: Self-pay | Admitting: Podiatry

## 2023-07-28 ENCOUNTER — Ambulatory Visit: Payer: Medicaid Other | Admitting: Podiatry

## 2024-02-17 IMAGING — US US OB LIMITED
1 series · 11 of 11 positions shown · non-contrast
Comparison: none

[Series 1: us ob limited · 11 of 11 slices shown]
[im 1/11]
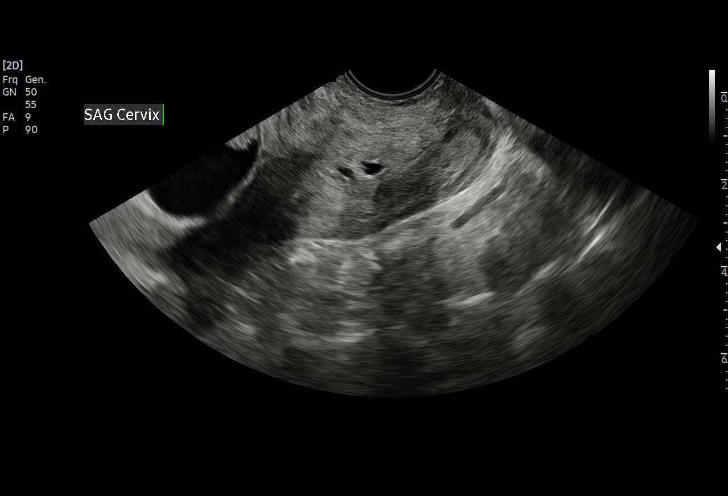
[im 2/11]
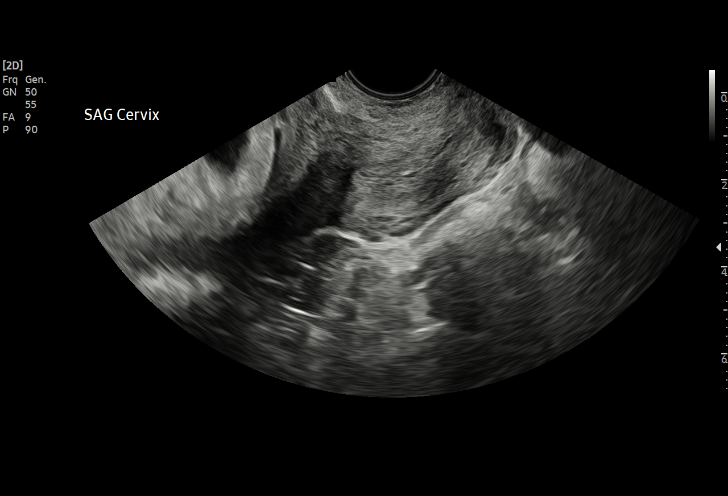
[im 3/11]
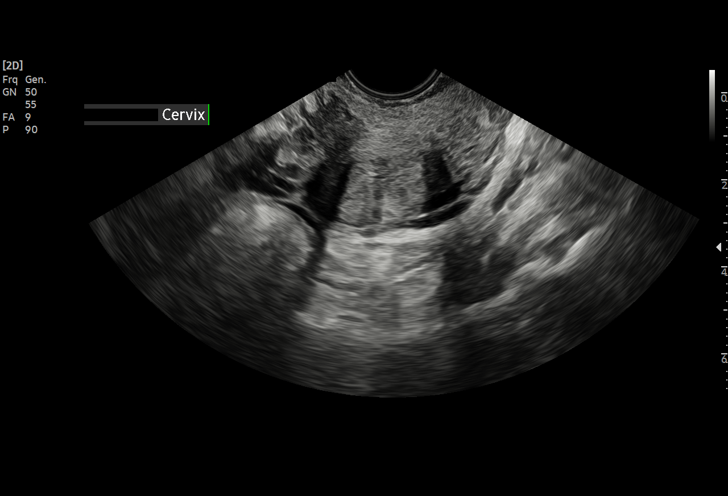
[im 4/11]
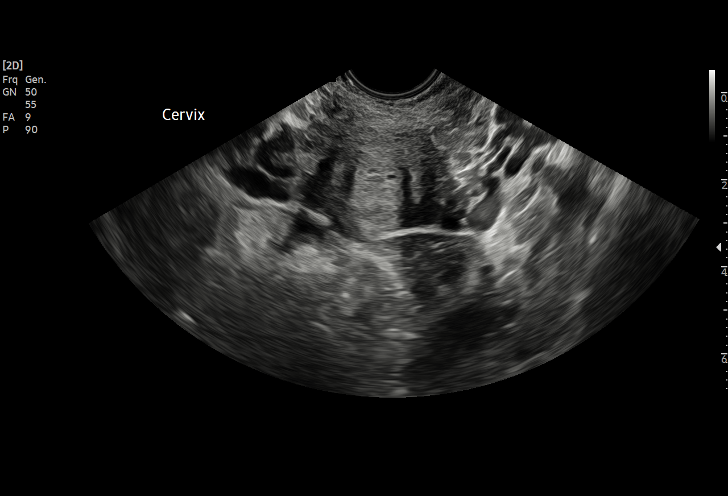
[im 5/11]
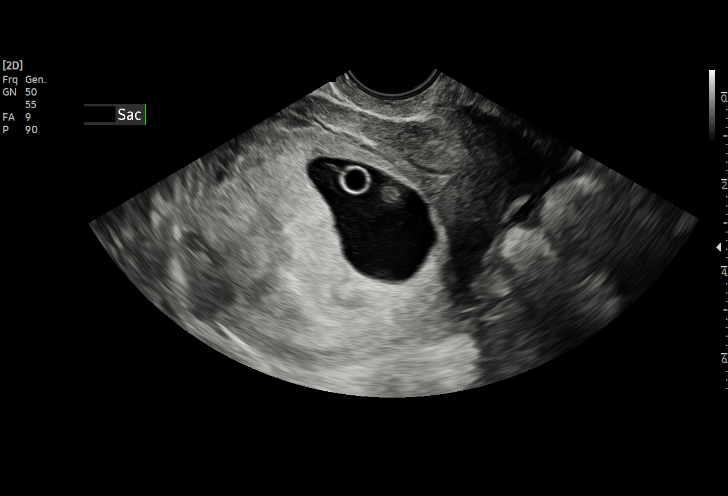
[im 6/11]
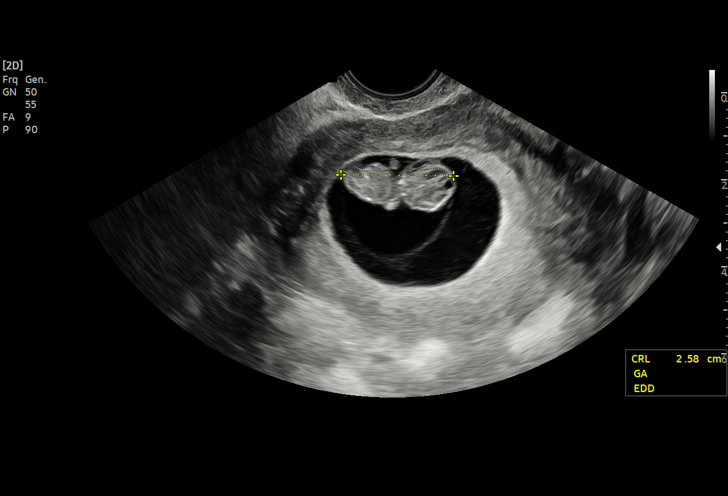
[im 7/11]
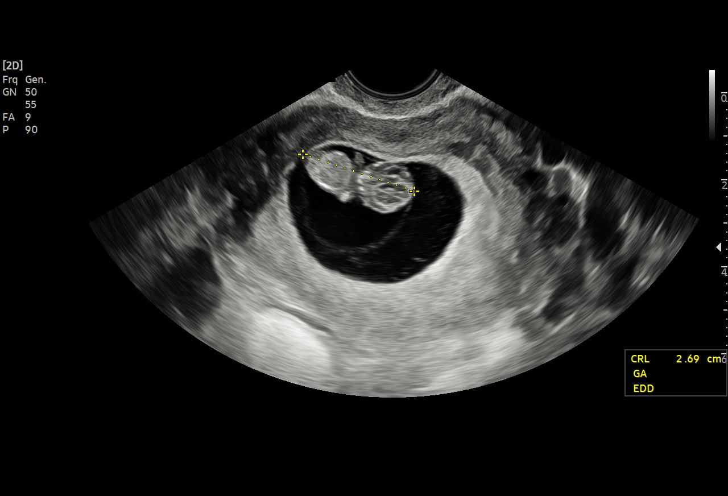
[im 8/11]
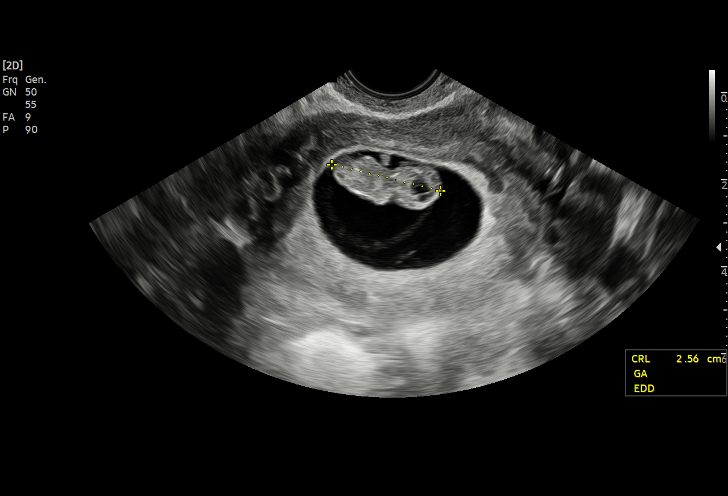
[im 9/11]
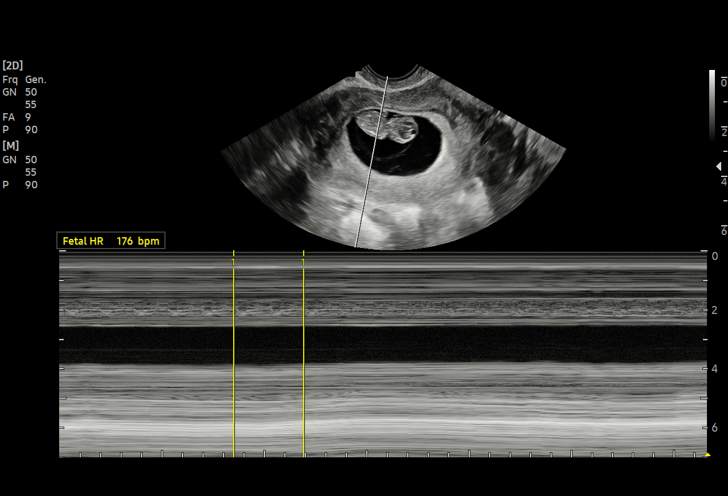
[im 10/11]
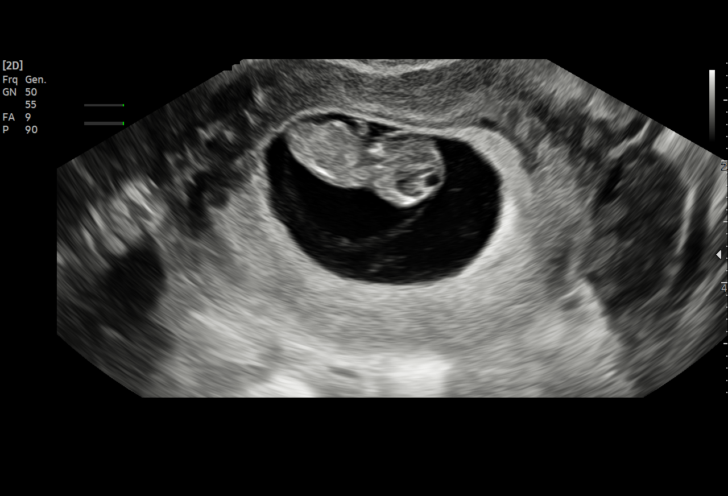
[im 11/11]
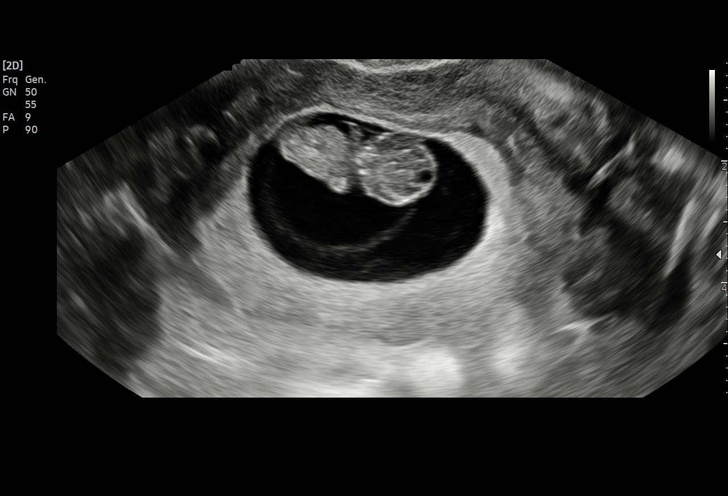

[11 of 11 positions shown; findings below may reference images not displayed]

[REDACTED]care

 1  [HOSPITAL]                         [address].0     BO WILLIAM KAMS

Indications

 9 weeks gestation of pregnancy
Fetal Evaluation

 Num Of Fetuses:         1
 Fetal Heart Rate(bpm):  176
 Cardiac Activity:       Observed
Biometry

 CRL:      26.1  mm     G. Age:  9w 2d                   EDD:   10/22/22
Gestational Age

 Best:          9w 2d      Det. By:  U/S C R L (03/21/22)     EDD:   10/22/22
Comments

 Single live IUP at 9w2d by CRL. Measurements are
 consistent with LMP provided 01/18/22.
Impression

 Single live IUP at 9w2d by CRL. Measurements are
 consistent with LMP provided 01/18/22.
                Coa, Delichia

## 2024-03-15 ENCOUNTER — Ambulatory Visit (INDEPENDENT_AMBULATORY_CARE_PROVIDER_SITE_OTHER): Payer: Medicaid Other | Admitting: Family Medicine

## 2024-03-15 ENCOUNTER — Telehealth (INDEPENDENT_AMBULATORY_CARE_PROVIDER_SITE_OTHER): Payer: Self-pay

## 2024-03-15 ENCOUNTER — Encounter (INDEPENDENT_AMBULATORY_CARE_PROVIDER_SITE_OTHER): Payer: Self-pay | Admitting: Family Medicine

## 2024-03-15 VITALS — BP 106/72 | HR 71 | Temp 98.2°F | Ht 59.0 in | Wt 199.0 lb

## 2024-03-15 DIAGNOSIS — F17290 Nicotine dependence, other tobacco product, uncomplicated: Secondary | ICD-10-CM

## 2024-03-15 DIAGNOSIS — R7303 Prediabetes: Secondary | ICD-10-CM | POA: Diagnosis not present

## 2024-03-15 DIAGNOSIS — Z6841 Body Mass Index (BMI) 40.0 and over, adult: Secondary | ICD-10-CM

## 2024-03-15 DIAGNOSIS — Z0289 Encounter for other administrative examinations: Secondary | ICD-10-CM

## 2024-03-15 DIAGNOSIS — E66813 Obesity, class 3: Secondary | ICD-10-CM | POA: Diagnosis not present

## 2024-03-15 DIAGNOSIS — F17200 Nicotine dependence, unspecified, uncomplicated: Secondary | ICD-10-CM

## 2024-03-15 NOTE — Progress Notes (Signed)
 Carlye Grippe, DO, ABFM, ABOM Bariatric physician [address] [place], [place], [place] [address] Office: [phone]  /  Fax: [phone]    Initial Evaluation:  Alice Humphrey was seen in clinic today to evaluate for obesity. She is interested in losing weight to improve overall health and reduce the risk of weight related complications. She presents today to review program treatment options, initial physical assessment, and evaluation.     She was referred by: PCP  When asked how has your weight affected you? She states: Has affected self-esteem, Contributed to orthopedic problems or mobility issues, and problems with eating patterns (overeating at night)  Contributing factors to her weight change: Family history of obesity, Eating patterns, and life event (gained 40 lbs in the past 2 years d/t emotional distress, overeating, and alcohol use after miscarriage)  Some associated conditions: Prediabetes  Current nutrition plan: None  Current level of physical activity: None  Current or previous pharmacotherapy: Phentermine (2020, lost 30-40 lbs)  Response to medication: Lost weight initially but was unable to sustain weight loss b/c pt stopped going to the clinic which prescribed the Phentermine.   Past Medical History:  Diagnosis Date   Medical history non-contributory    Pre-diabetes 2006    Current Outpatient Medications  Medication Instructions   cetirizine (ZYRTEC) 10 mg, Daily   hydrOXYzine (ATARAX) 25 mg, As needed     No Known Allergies   Past Surgical History:  Procedure Laterality Date   NO PAST SURGERIES       Family History  Problem Relation Age of Onset   AAA (abdominal aortic aneurysm) Mother    Hypertension Maternal Grandmother    Diabetes Maternal Grandmother    Hypertension Maternal Grandfather    Diabetes Maternal Grandfather    COPD Maternal Grandfather      Objective:  BP 106/72   Pulse 71   Temp 98.2 F (36.8 C)   Ht 4\' 11"  (1.499 m)    Wt 199 lb (90.3 kg)   LMP 03/09/2024   SpO2 98%   BMI 40.19 kg/m  She was weighed on the bioimpedance scale: Body mass index is 40.19 kg/m.  Visceral Fat %: 11 , Body Fat %: 46.8%  Weight Lost Since Last Visit: na  Weight Gained Since Last Visit: na   Vitals Temp: 98.2 F (36.8 C) BP: 106/72 Pulse Rate: 71 SpO2: 98 %   Anthropometric Measurements Height: 4\' 11"  (1.499 m) Weight: 199 lb (90.3 kg) BMI (Calculated): 40.17 Weight at Last Visit: na Weight Lost Since Last Visit: na Weight Gained Since Last Visit: na Starting Weight: na Total Weight Loss (lbs):  (0 kg) (na) Peak Weight: 205lb Waist Measurement : 0 inches (na)   Body Composition  Body Fat %: 46.8 % Fat Mass (lbs): 93.2 lbs Muscle Mass (lbs): 100.6 lbs Total Body Water (lbs): 77.8 lbs Visceral Fat Rating : 11   Other Clinical Data A1c: 0 mg/dL (na) RMR: 0 (na) Fasting: no Labs: no Today's Visit #: Info Session Starting Date:  (na) Comments: Info Session    General: Well Developed, well nourished, and in no acute distress.  HEENT: Normocephalic, atraumatic; EOMI, sclerae are anicteric. Skin: Warm and dry, good turgor Chest:  Normal excursion, shape, no gross ABN Respiratory: No conversational dyspnea; speaking in full sentences NeuroM-Sk:  Normal gross ROM * 4 extremities  Psych: A and O *3, insight adequate, mood- full    Assessment and Plan:   FOR THE DISEASE OF OBESITY:  Class 3  severe obesity due to excess calories with body mass index (BMI) of 40.0 to 44.9 in adult, unspecified whether serious comorbidity present [facility]) Assessment & Plan: We reviewed anthropometrics, biometrics, associated medical conditions and contributing factors with patient. Alice Humphrey would benefit from a medically tailored reduced calorie nutrional plan based on her REE (resting energy expenditure), which will be determined by indirect calorimetry.  We will also assess for cardiometabolic risk and nutritional  derangements via fasting labs at intake appointment.    Obesity Treatment / Action Plan:   she was weighed on the bioimpedance scale and results were discussed and documented in the synopsis.   Alice Humphrey will complete provided nutritional and psychosocial assessment questionnaire before the next appointment.  she will be scheduled for indirect calorimetry to determine resting energy expenditure in a fasting state.  This will allow [place] to create a reduced calorie, high-protein meal plan to promote loss of fat mass while preserving muscle mass.  We will also assess for cardiometabolic risk and nutritional derangements via an ECG and fasting serologies at her next appointment.  she was encouraged to work on amassing support from family and friends to begin their weight loss journey.   Work on eliminating or reducing the presence of highly processed, poorly nutritious, calorie-dense foods in the home.   Obesity Education Performed Today:  Patient was counseled on nutritional approaches to weight loss and benefits of reducing processed foods and consuming plant-based foods and high quality protein as part of nutritional weight management program.   We discussed the importance of long term lifestyle changes which include nutrition, exercise and behavioral modifications as well as the importance of customizing this to her specific health and social needs.   We discussed the benefits of reaching a healthier weight to alleviate the symptoms of existing conditions and reduce the risks of the biomechanical, metabolic and psychological effects of obesity.  Was counseled on the health benefits of losing 5%-10% of total body weight.  Was counseled on our cognitive behavorial therapy program, lead by our bariatric psychologist, who focuses on emotional eating and creating positive behavorial change.  Was counseled on bariatric pharmacotherapy and how this may be used as an adjunct in their weight  management   Alice Humphrey appears to be in the action stage of change and states they are ready to start intensive lifestyle modifications and behavioral modifications.  It was recommended that she follow up in the next 1-2 weeks to review the above steps, and to continue with treatment of their chronic disease state of obesity   FOR OTHER CONDITIONS RELATED TO THE DISEASE OF OBESITY:  Prediabetes Assessment & Plan: Onset: 5th grade or so per patient. At that time, she was on Metformin for a 2 month period. Reports taking it irregularly and eventually stopped it d/t being lost to follow up. Most recent HbA1c of 5.1 on 07/23/2023. She would benefit from a high protein low-carb meal plan. We will check fasting blood glucose and insulin levels with intake labs at next OV if she decides to join our wt loss program.    Smoking Assessment & Plan: Reports vaping legal Delta-8 THC daily. Also endorses sometimes using Delta-8 THC wrapped in tobacco products. Discussed the risks of smoking to lung health and encouraged pt to quit. Will discuss strategies for quitting if pt decides to join our program.    Attestations:   Reviewed by clinician on day of visit: allergies, medications, problem list, medical history, surgical history, family history, social history,  and previous encounter notes pertinent to obesity diagnosis. 43 minutes was spent today on this visit including the above counseling, pre-visit chart review, and post-visit documentation. Over 50% of this time was spent in direct, face-to-face counseling and coordination of care  I, Special Randolm Idol, acting as a medical scribe for Thomasene Lot, DO., have compiled all relevant documentation for today's office visit on behalf of Thomasene Lot, DO, while in the presence of [employer], DO.  I have reviewed the above documentation for accuracy and completeness, and I agree with the above. Carlye Grippe, D.O.  The 21st Century Cures Act was  signed into law in 2016 which includes the topic of electronic health records.  This provides immediate access to information in MyChart.  This includes consultation notes, operative notes, office notes, lab results and pathology reports.  If you have any questions about what you read please let us know at your next visit so we can discuss your concerns and take corrective action if need be.  We are right here with you!

## 2024-03-15 NOTE — Telephone Encounter (Signed)
 Pt. Checked in at 8:38 but due to Dr Sharee Holster having an 8:40 and a 9:00, I could not take the patient early. I did ask Dr. Val Eagle and she also said not to take the patient early.

## 2024-04-12 ENCOUNTER — Ambulatory Visit (INDEPENDENT_AMBULATORY_CARE_PROVIDER_SITE_OTHER): Admitting: Family Medicine

## 2024-04-12 ENCOUNTER — Encounter (INDEPENDENT_AMBULATORY_CARE_PROVIDER_SITE_OTHER): Payer: Self-pay | Admitting: Family Medicine

## 2024-04-12 VITALS — BP 101/68 | HR 72 | Temp 98.4°F | Ht 59.0 in | Wt 196.0 lb

## 2024-04-12 DIAGNOSIS — R0602 Shortness of breath: Secondary | ICD-10-CM

## 2024-04-12 DIAGNOSIS — R5383 Other fatigue: Secondary | ICD-10-CM | POA: Diagnosis not present

## 2024-04-12 DIAGNOSIS — Z1331 Encounter for screening for depression: Secondary | ICD-10-CM

## 2024-04-12 DIAGNOSIS — R142 Eructation: Secondary | ICD-10-CM

## 2024-04-12 DIAGNOSIS — F5089 Other specified eating disorder: Secondary | ICD-10-CM

## 2024-04-12 DIAGNOSIS — Z6841 Body Mass Index (BMI) 40.0 and over, adult: Secondary | ICD-10-CM

## 2024-04-12 DIAGNOSIS — E66813 Obesity, class 3: Secondary | ICD-10-CM

## 2024-04-12 DIAGNOSIS — R7303 Prediabetes: Secondary | ICD-10-CM

## 2024-04-12 DIAGNOSIS — F17200 Nicotine dependence, unspecified, uncomplicated: Secondary | ICD-10-CM

## 2024-04-12 DIAGNOSIS — F39 Unspecified mood [affective] disorder: Secondary | ICD-10-CM

## 2024-04-12 DIAGNOSIS — F1290 Cannabis use, unspecified, uncomplicated: Secondary | ICD-10-CM | POA: Diagnosis not present

## 2024-04-12 NOTE — Progress Notes (Signed)
 Rae Bugler, D.O.  ABFM, ABOM Specializing in Clinical Bariatric Medicine Office located at: [address] [place], [place]  [address]   Bariatric Medicine Visit  Dear [facility], [facility]   Thank you for referring Alice Humphrey to our clinic today for evaluation.  We performed a consultation to discuss her options for treatment and educate the patient on her disease state.  The following note includes my evaluation and treatment recommendations.   Please do not hesitate to reach out to me directly if you have any further concerns.   Assessment and Plan:   Orders Placed This Encounter  Procedures   VITAMIN D  25 Hydroxy (Vit-D Deficiency, Fractures)   TSH   T4, free   T3   Lipid panel   Insulin , random   Hemoglobin A1c   Folate   Comprehensive metabolic panel with GFR   Vitamin B12   CBC with Differential/Platelet   EKG 12-Lead   FOR THE DISEASE OF OBESITY:  Class 3 severe obesity due to excess calories with body mass index (BMI) of 40.0 to 44.9 in adult, unspecified whether serious comorbidity present [facility]) Assessment & Plan: Alice Humphrey is currently in the action stage of change. As such, her goal is to start our weight management plan.  She has agreed to implement: follow the Category 1 plan with Lunch options.    Behavioral Intervention We discussed the following today: begin to work on maintaining a reduced calorie state, getting the recommended amount of protein, incorporating whole foods, making healthy choices, staying well hydrated and practicing mindfulness when eating.  Additional resources provided today: Handout on CAT 1 meal plan  and Handout on CAT 1-2 lunch options, Handout on CAT 1 sample grocery list, Handout on healthy tuna salad recipe.   Evidence-based interventions for health behavior change were utilized today including the discussion of self monitoring techniques, problem-solving barriers and SMART goal setting techniques.    Pt will  specifically work on: measuring lean protein and veggie intake   Recommended Physical Activity Goals Alice Humphrey has been advised to work up to 150 minutes of moderate intensity aerobic activity a week and strengthening exercises 2-3 times per week for cardiovascular health, weight loss maintenance and preservation of muscle mass.   She has agreed to : maintain current level of activity.    Pharmacotherapy We both agreed to : begin with nutritional and behavioral strategies   FOR ASSOCIATED CONDITIONS ADDRESSED TODAY:   Fatigue Assessment & Plan: Alice Humphrey does feel that her weight is causing her energy to be lower than it should be. Fatigue may be related to obesity, depression or many other causes. she does not appear to have any red flag symptoms and this appears to most likely be related to her current lifestyle habits and dietary intake.  Epworth sleepiness scale is 6 and appears to be within normal limits. Alice Humphrey admits to some daytime somnolence and admits to waking up still tired. Patient has a history of morning headache (1x per week on average). Alice Humphrey generally gets 5 hours of sleep per night, and states that she has generally unrestful sleep. Snoring is not present. Apneic episodes are not present.   Discussed proper sleep hygiene practices and encouraged pt to aim for 7-9 hrs of sleep per night for the best weight loss results and for her overall health/well being.    ECG: Performed and reviewed/ interpreted independently.  Normal sinus rhythm, rate 60 bpm; reassuring without any acute abnormalities, will continue to  monitor for symptoms    Shortness of breath on exertion Assessment & Plan: Alice Humphrey does feel that she gets out of breath more easily than she used to when she exercises and seems to be worsening over time with weight gain.  This has gotten worse recently. Alice Humphrey denies shortness of breath at rest or orthopnea. Pt denies chest pain, dizziness, heart palpitations, or  excessive diaphoresis or nausea with activity.  This is not new and is ongoing.  [name]'s shortness of breath appears to be obesity related and exercise induced, as they do not appear to have any "red flag" symptoms/ concerns today.  Also, this condition appears to be related to a state of poor cardiovascular conditioning   Obtain labs today and will be reviewed with her at their next office visit in two weeks.  Indirect Calorimeter completed today to help guide our dietary regimen. It shows a VO2 of 193 and a REE of 1325.  Her calculated basal metabolic rate is 1610 thus her resting energy expenditure is worse than expected.  Patient agreed to work on weight loss at this time.  As [name] progresses through our weight loss program, we will gradually increase exercise as tolerated to treat her current condition.   If Alice Humphrey follows our recommendations and loses 5-10% of their weight without improvement of her shortness of breath or if at any time, symptoms become more concerning, they agree to urgently follow up with their PCP/ specialist for further consideration/ evaluation.   Alice Humphrey verbalizes agreement with this plan.    Mood disorder (HCC) - emotional eating/Depression Screen  Assessment & Plan: H/o depression and anxiety. On a regimen of Hydroxyzine  prn for anxiety and heart racing.  Her PHQ-9 score was positive at 11.Denies any SI/HI. Mood is stable today. Has several emotional eating tendencies; eats when stressed, sad, bored, guilty, to help comfort herself, and as a reward. Continue current regimen.  Patient was referred to Dr. Delaine Favorite, our Bariatric Psychologist, for evaluation due to her elevated PHQ-9 score and struggles with emotional eating. Start prudent nutritional health which can improve emotional well being.    Prediabetes Assessment & Plan: Most recent A1c: Lab Results  Component Value Date   HGBA1C 5.1 04/01/2022   HGBA1C 5.0 07/01/2018    Onset: "5th grade or so" per  patient. At that time, she was on Metformin for a 2 month period. Reports taking it irregularly and eventually stopped it d/t being lost to follow up. Begin balanced diet focusing on protein, fruits, and vegetables while limiting simple carbohydrates.    Burping Assessment & Plan: Early 19 yos started burping a lot and sometimes  throw ups when burning.  Worsened after pregnancy.  Plan: see GI doctor and start PNP and eating on more regularly basis and smaller meals    Frequnrly brups.    Current smoker - daily THC use for past 10 years Assessment & Plan: Reports vaping legal Delta-8 THC daily. Will sometimes use Delta-8 THC products wrapped in hemp or occasionally in tobacco. Discussed the risks of smoking to lung health and encouraged pt to quit. Will discuss quitting strategies in the future.    FOLLOW UP:   Follow up in 2 weeks. She was informed of the importance of frequent follow up visits to maximize her success with intensive lifestyle modifications for her multiple health conditions.  Alice Humphrey is aware that we will review all of her lab results at our next visit.  She is aware that if anything  is critical/ life threatening with the results, we will be contacting her via MyChart prior to the office visit to discuss management.    Chief Complaint:   OBESITY Alice Humphrey (MR# [id number]) is a pleasant 28 y.o. female who presents for evaluation and treatment of obesity and related comorbidities. Current BMI is Body mass index is 39.59 kg/m. Alice Humphrey has been struggling with her weight for many years and has been unsuccessful in either losing weight, maintaining weight loss, or reaching her healthy weight goal.  Alice Humphrey is currently in the action stage of change and ready to dedicate time achieving and maintaining a healthier weight. Alice Humphrey is interested in becoming our patient and working on intensive lifestyle modifications including (but not limited  to) diet and exercise for weight loss.  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 Pt is single and lives with her 15 y.o daughter Alice Humphrey.   Current weight is her highest weight - attributes weight gain to "eating too much unhealthy foods and not exercising".   Desires to be 130 lbs within 2 years to improve mobility, health and foot pain.   Has tried Phentermine in 2020, lost 30-40 lbs. Was unable to sustain weight loss b/c pt stopped going to the clinic which prescribed the Phentermine.    Does not engage in formal exercise.   Eats fast food or take out 3+ days a week.   Dislikes cooking.   Craves sweet, salty,and crunchy foods  Snacks on the following foods between meals and after dinner: chips, ice-cream and chocolate.   Skips random meals depending on the day.   Drinks 4 coke zeros per day, coffee with creamer and sugar, milk, juice, tea with sugar and sweeteners, and alcohol during social occasions.   Worst food habit: "eating junk food at night"   Subjective:   This is the patient's first visit at [facility].  The patient's NEW PATIENT PACKET that they filled out prior to today's office visit was reviewed at length and information from that paperwork was included within the following office visit note.    Included in the packet: current and past health history, medications, allergies, ROS, gynecologic history (women only), surgical history, family history, social history, weight history, weight loss surgery history (for those that have had weight loss surgery), nutritional evaluation, mood and food questionnaire along with a depression screening (PHQ9) on all patients, an Epworth questionnaire, sleep habits questionnaire, patient life and health improvement goals questionnaire. These will all be scanned into the patient's chart under the "media" tab.   Review of Systems: Please refer to new patient packet scanned into media. Pertinent positives were  addressed with patient today.  Reviewed by clinician on day of visit: allergies, medications, problem list, medical history, surgical history, family history, social history, and previous encounter notes.  During the visit, I independently reviewed the patient's EKG, bioimpedance scale results, and indirect calorimeter results. I used this information to tailor a meal plan for the patient that will help Alice Humphrey to lose weight and will improve her obesity-related conditions going forward.  I performed a medically necessary appropriate examination and/or evaluation. I discussed the assessment and treatment plan with the patient. The patient was provided an opportunity to ask questions and all were answered. The patient agreed with the plan and demonstrated an understanding of the instructions. Labs were ordered today (unless patient declined them) and will be reviewed with the patient  at our next visit unless more critical results need to be addressed immediately. Clinical information was updated and documented in the EMR.    Objective:   PHYSICAL EXAM: Blood pressure 101/68, pulse 72, temperature 98.4 F (36.9 C), height 4\' 11"  (1.499 m), weight 196 lb (88.9 kg), last menstrual period 03/09/2024, SpO2 98%, unknown if currently breastfeeding. Body mass index is 39.59 kg/m.  General: Well Developed, well nourished, and in no acute distress.  HEENT: Normocephalic, atraumatic; EOMI, sclerae are anicteric. Skin: Warm and dry, good turgor Chest:  Normal excursion, shape, no gross ABN Respiratory: No conversational dyspnea; speaking in full sentences NeuroM-Sk:  Normal gross ROM * 4 extremities  Psych: A and O *3, insight adequate, mood- full   Anthropometric Measurements Height: 4\' 11"  (1.499 m) Weight: 196 lb (88.9 kg) BMI (Calculated): 39.57 Weight at Last Visit: N/A Weight Lost Since Last Visit: N/A Weight Gained Since Last Visit: N/A Starting Weight: 196LB Total Weight Loss (lbs): 0  lb (0 kg) Peak Weight: 205lb Waist Measurement : 43 inches   Body Composition  Body Fat %: 47 % Fat Mass (lbs): 92.4 lbs Muscle Mass (lbs): 99 lbs Total Body Water (lbs): 77 lbs Visceral Fat Rating : 11   Other Clinical Data RMR: 1325 Fasting: Yes Labs: Yes Today's Visit #: #1 Starting Date: 04/12/24    DIAGNOSTIC DATA REVIEWED:  BMET    Component Value Date/Time   NA 140 08/15/2015 2321   K 4.4 08/15/2015 2321   CL 107 08/15/2015 2321   CO2 28 08/15/2015 2321   GLUCOSE 96 08/15/2015 2321   BUN <5 (L) 08/15/2015 2321   CREATININE 0.66 08/15/2015 2321   CALCIUM 8.3 (L) 08/15/2015 2321   GFRNONAA >60 08/15/2015 2321   GFRAA >60 08/15/2015 2321   Lab Results  Component Value Date   HGBA1C 5.1 04/01/2022   HGBA1C 5.0 07/01/2018   No results found for: "INSULIN " No results found for: "TSH" CBC    Component Value Date/Time   WBC 12.2 (H) 04/01/2022 1545   WBC 14.9 (H) 01/19/2019 0314   RBC 4.08 04/01/2022 1545   RBC 3.99 01/19/2019 0314   HGB 13.0 04/01/2022 1545   HCT 38.1 04/01/2022 1545   PLT 311 04/01/2022 1545   MCV 93 04/01/2022 1545   MCH 31.9 04/01/2022 1545   MCH 29.6 01/19/2019 0314   MCHC 34.1 04/01/2022 1545   MCHC 33.2 01/19/2019 0314   RDW 12.7 04/01/2022 1545   Iron Studies No results found for: "IRON", "TIBC", "FERRITIN", "IRONPCTSAT" Lipid Panel  No results found for: "CHOL", "TRIG", "HDL", "CHOLHDL", "VLDL", "LDLCALC", "LDLDIRECT" Hepatic Function Panel     Component Value Date/Time   PROT 5.0 (L) 08/15/2015 2321   ALBUMIN 2.7 (L) 08/15/2015 2321   AST 16 08/15/2015 2321   ALT 18 08/15/2015 2321   ALKPHOS 49 08/15/2015 2321   BILITOT 0.4 08/15/2015 2321   No results found for: "TSH" Nutritional No results found for: "VD25OH"  Attestation Statements:   I, Special Puri, acting as a Stage manager for [employer], DO., have compiled all relevant documentation for today's office visit on behalf of Alice Sensor, DO, while  in the presence of [employer], DO.  Reviewed by clinician on day of visit: allergies, medications, problem list, medical history, surgical history, family history, social history, and previous encounter notes pertinent to patient's obesity diagnosis.  I have spent X minutes in the care of the patient today including: preparing to see patient (e.g. review and interpretation  of tests, old notes ), obtaining and/or reviewing separately obtained history, performing a medically appropriate examination or evaluation, counseling and educating the patient, ordering medications, test or procedures, documenting clinical information in the electronic or other health care record, and independently interpreting results and communicating results to the patient, family, or caregiver   I have reviewed the above documentation for accuracy and completeness, and I agree with the above. Rae Bugler, D.O.  The 21st Century Cures Act was signed into law in 2016 which includes the topic of electronic health records.  This provides immediate access to information in MyChart.  This includes consultation notes, operative notes, office notes, lab results and pathology reports.  If you have any questions about what you read please let us  know at your next visit so we can discuss your concerns and take corrective action if need be.  We are right here with you.

## 2024-04-14 LAB — INSULIN, RANDOM: INSULIN: 7.3 u[IU]/mL (ref 2.6–24.9)

## 2024-04-14 LAB — LIPID PANEL
Chol/HDL Ratio: 3.3 ratio (ref 0.0–4.4)
Cholesterol, Total: 166 mg/dL (ref 100–199)
HDL: 51 mg/dL (ref >39)
LDL Chol Calc (NIH): 104 mg/dL — ABNORMAL HIGH (ref 0–99)
Triglycerides: 56 mg/dL (ref 0–149)
VLDL Cholesterol Cal: 11 mg/dL (ref 5–40)

## 2024-04-14 LAB — COMPREHENSIVE METABOLIC PANEL WITH GFR
ALT: 14 IU/L (ref 0–32)
AST: 15 IU/L (ref 0–40)
Albumin: 4.1 g/dL (ref 4.0–5.0)
Alkaline Phosphatase: 115 IU/L (ref 44–121)
BUN/Creatinine Ratio: 11 (ref 9–23)
BUN: 7 mg/dL (ref 6–20)
Bilirubin Total: 0.4 mg/dL (ref 0.0–1.2)
CO2: 22 mmol/L (ref 20–29)
Calcium: 9.2 mg/dL (ref 8.7–10.2)
Chloride: 104 mmol/L (ref 96–106)
Creatinine, Ser: 0.65 mg/dL (ref 0.57–1.00)
Globulin, Total: 3 g/dL (ref 1.5–4.5)
Glucose: 83 mg/dL (ref 70–99)
Potassium: 4.9 mmol/L (ref 3.5–5.2)
Sodium: 142 mmol/L (ref 134–144)
Total Protein: 7.1 g/dL (ref 6.0–8.5)
eGFR: 123 mL/min/{1.73_m2} (ref >59)

## 2024-04-14 LAB — CBC WITH DIFFERENTIAL/PLATELET
Basophils Absolute: 0 10*3/uL (ref 0.0–0.2)
Basos: 1 %
EOS (ABSOLUTE): 0.1 10*3/uL (ref 0.0–0.4)
Eos: 1 %
Hematocrit: 40.8 % (ref 34.0–46.6)
Hemoglobin: 13.1 g/dL (ref 11.1–15.9)
Immature Grans (Abs): 0 10*3/uL (ref 0.0–0.1)
Immature Granulocytes: 0 %
Lymphocytes Absolute: 1.7 10*3/uL (ref 0.7–3.1)
Lymphs: 23 %
MCH: 29.4 pg (ref 26.6–33.0)
MCHC: 32.1 g/dL (ref 31.5–35.7)
MCV: 92 fL (ref 79–97)
Monocytes Absolute: 0.6 10*3/uL (ref 0.1–0.9)
Monocytes: 9 %
Neutrophils Absolute: 4.7 10*3/uL (ref 1.4–7.0)
Neutrophils: 66 %
Platelets: 332 10*3/uL (ref 150–450)
RBC: 4.45 x10E6/uL (ref 3.77–5.28)
RDW: 12.7 % (ref 11.7–15.4)
WBC: 7.2 10*3/uL (ref 3.4–10.8)

## 2024-04-14 LAB — VITAMIN D 25 HYDROXY (VIT D DEFICIENCY, FRACTURES): Vit D, 25-Hydroxy: 20.7 ng/mL — ABNORMAL LOW (ref 30.0–100.0)

## 2024-04-14 LAB — HEMOGLOBIN A1C
Est. average glucose Bld gHb Est-mCnc: 108 mg/dL
Hgb A1c MFr Bld: 5.4 % (ref 4.8–5.6)

## 2024-04-14 LAB — T3: T3, Total: 120 ng/dL (ref 71–180)

## 2024-04-14 LAB — TSH: TSH: 0.761 u[IU]/mL (ref 0.450–4.500)

## 2024-04-14 LAB — VITAMIN B12: Vitamin B-12: 425 pg/mL (ref 232–1245)

## 2024-04-14 LAB — T4, FREE: Free T4: 1.22 ng/dL (ref 0.82–1.77)

## 2024-04-14 LAB — FOLATE: Folate: 11.9 ng/mL (ref >3.0)

## 2024-04-26 ENCOUNTER — Ambulatory Visit (INDEPENDENT_AMBULATORY_CARE_PROVIDER_SITE_OTHER): Admitting: Family Medicine

## 2024-04-26 ENCOUNTER — Encounter (INDEPENDENT_AMBULATORY_CARE_PROVIDER_SITE_OTHER): Payer: Self-pay | Admitting: Family Medicine

## 2024-04-26 VITALS — BP 103/66 | HR 79 | Temp 98.5°F | Ht 59.0 in | Wt 193.0 lb

## 2024-04-26 DIAGNOSIS — R7303 Prediabetes: Secondary | ICD-10-CM

## 2024-04-26 DIAGNOSIS — E7849 Other hyperlipidemia: Secondary | ICD-10-CM | POA: Diagnosis not present

## 2024-04-26 DIAGNOSIS — E559 Vitamin D deficiency, unspecified: Secondary | ICD-10-CM

## 2024-04-26 DIAGNOSIS — Z6841 Body Mass Index (BMI) 40.0 and over, adult: Secondary | ICD-10-CM

## 2024-04-26 DIAGNOSIS — E538 Deficiency of other specified B group vitamins: Secondary | ICD-10-CM | POA: Diagnosis not present

## 2024-04-26 DIAGNOSIS — E66813 Obesity, class 3: Secondary | ICD-10-CM

## 2024-04-26 MED ORDER — CYANOCOBALAMIN 500 MCG PO TABS: 500.0000 ug | ORAL_TABLET | Freq: Every day | ORAL | Status: DC

## 2024-04-26 MED ORDER — VITAMIN D (ERGOCALCIFEROL) 1.25 MG (50000 UNIT) PO CAPS
50000.0000 [IU] | ORAL_CAPSULE | ORAL | 1 refills | Status: DC
Start: 2024-04-26 — End: 2024-06-16

## 2024-04-26 NOTE — Progress Notes (Signed)
 Alice Humphrey, D.O.  ABFM, ABOM Clinical Bariatric Medicine Physician  Office located at: [address] [place], [place]  [address]    Assessment and Plan:   Meds ordered this encounter  Medications   Vitamin D , Ergocalciferol , (DRISDOL ) 1.25 MG (50000 UNIT) CAPS capsule    Sig: Take 1 capsule (50,000 Units total) by mouth every 7 (seven) days.    Dispense:  4 capsule    Refill:  1   cyanocobalamin  (VITAMIN B12) 500 MCG tablet    Sig: Take 1 tablet (500 mcg total) by mouth daily.    FOR THE DISEASE OF OBESITY:  Class 3 severe obesity due to excess calories with body mass index (BMI) of 40.0 to 44.9 in adult, unspecified whether serious comorbidity present Assessment & Plan: Since last office visit on 04/12/2024 patient's  Muscle mass has decreased by 1.2 lb. Fat mass has decreased by 1.8 lb. Total body water has increased by 1.6 lb.  Counseling done on how various foods will affect these numbers and how to maximize success  Total lbs lost to date: 3 lbs  Total weight loss percentage to date: 1.53%    Recommended Dietary Goals Alice Humphrey is currently in the action stage of change. As such, her goal is to continue weight management plan.  She has agreed to: journal intake on tracking application with the Cat 1 MP w/ Lunch options as a guide.    Behavioral Intervention We discussed the following today: work on tracking and journaling calories using tracking application and using GPT or another AI platform for recipe ideas- searching "low calorie, low carb, high protein chicken recipes" etc  Additional resources provided today: Handout on Daily Food Journaling Log, Handout on Pre-Diabetes education , Handout on insulin  resistance education, and Handout on benefits of metformin for weight loss  Evidence-based interventions for health behavior change were utilized today including the discussion of self monitoring techniques, problem-solving barriers and SMART goal setting  techniques.  Regarding patient's less desirable eating habits and patterns, we employed the technique of small changes.   Pt will specifically work on: n/a   Recommended Physical Activity Goals Alice Humphrey has been advised to work up to 150 minutes of moderate intensity aerobic activity a week and strengthening exercises 2-3 times per week for cardiovascular health, weight loss maintenance and preservation of muscle mass.   She has agreed to : Increase physical activity in their day and reduce sedentary time (increase NEAT).   Pharmacotherapy We both agreed to : continue with nutritional and behavioral strategies   ASSOCIATED CONDITIONS ADDRESSED TODAY:  Prediabetes Assessment & Plan: Most recent labs: Lab Results  Component Value Date   HGBA1C 5.4 04/12/2024   HGBA1C 5.1 04/01/2022   HGBA1C 5.0 07/01/2018   INSULIN  7.3 04/12/2024   Lab Results  Component Value Date   CREATININE 0.65 04/12/2024   BUN 7 04/12/2024   NA 142 04/12/2024   K 4.9 04/12/2024   CL 104 04/12/2024   CO2 22 04/12/2024      Component Value Date/Time   PROT 7.1 04/12/2024 1032   ALBUMIN 4.1 04/12/2024 1032   AST 15 04/12/2024 1032   ALT 14 04/12/2024 1032   ALKPHOS 115 04/12/2024 1032   BILITOT 0.4 04/12/2024 1032    Lab Results  Component Value Date   WBC 7.2 04/12/2024   HGB 13.1 04/12/2024   HCT 40.8 04/12/2024   MCV 92 04/12/2024   PLT 332 04/12/2024   Lab Results  Component Value  Date   TSH 0.761 04/12/2024   FREET4 1.22 04/12/2024   T3TOTAL 120 04/12/2024    Pre-DM Onset: "5th grade or so" per patient. At that time, she was on Metformin for a 2 month period. Reports taking it irregularly and eventually stopped it d/t being lost to follow up.   Reviewed labs above. Her A1c is at goal. Fasting insulin  is mildly elevated at 7.3. Her kidney function, electrolytes, liver enzymes, blood counts, and thyroid levels are within recommend limits.   Patient aware of disease state and risk of  progression. Explained role of simple carbs and insulin  levels on hunger and cravings. Educated patient that having adequate amounts of protein with each meal is important for increasing muscle mass, stabilizing sugars, controlling hunger and cravings, and improving thermogenesis. Continue working on nutrition plan to decrease simple carbohydrates, increase lean proteins and exercise to promote weight loss. Consider restarting Metformin the future if needed.   Other hyperlipidemia - new onset Assessment & Plan: Most recent labs: Lab Results  Component Value Date   CHOL 166 04/12/2024   HDL 51 04/12/2024   LDLCALC 104 (H) 04/12/2024   TRIG 56 04/12/2024   CHOLHDL 3.3 04/12/2024   Lipid panel WNL w/expection of LDL. Elevated LDL may be secondary to nutrition, genetics and spillover effect from excess adiposity. Continue to maintain a diet low in saturated fats and trans fats. Losing 10% of body weight may improve LDL values. Recommend pt to explore the American Heart Association website for further information about high cholesterol.    B12 insufficiency - new onset Assessment & Plan: Most recent labs: Lab Results  Component Value Date   VITAMINB12 425 04/12/2024   FOLATE 11.9 04/12/2024    Folate level is normal. B12 level is 425, not at goal of over 500. This diagnosis was reviewed with the patient and education was provided. Pt is agreeable to STARTING OTC B12 500 mcg daily. Recheck levels in 3 months.    Vitamin D  deficiency - new onset Assessment & Plan: Most recent labs: Lab Results  Component Value Date   VD25OH 20.7 (L) 04/12/2024   Her Vitamin D  levels are not at goal of 50 to 70. Low vitamin D  levels can be associated with adiposity and may result in leptin resistance and weight gain. Also associated with fatigue. After discussion of benefits, alternative treatment options and side effects patient will be STARTED on ERGO 50,000 units weekly. Recheck levels in 3 mos.     FOLLOW UP:   Return 05/12/2024 at 12:20 PM wit Jenean Minus, MD. She was informed of the importance of frequent follow up visits to maximize her success with intensive lifestyle modifications for her multiple health conditions.  Subjective:   Chief complaint: Obesity Alice Humphrey is here to discuss her progress with her obesity treatment plan. She is  following the Category 1 plan with Lunch options and states she is following her eating plan approximately 90% of the time. She states she is not exercising.  Interval History:  Alice Humphrey is here today for her first follow-up office visit since starting the program with us . Patient is off to a good start and has lost 3 lbs. She acknowledges deviating from her meal plan over the weekend; apart from this endorses she has been following it closely.  She felt that the protein portions were large but was able to mostly get it in. Used CHAT-GPT to journal her intake. Her mood has been controlled but sometimes found her  self doing boredom eating. Pt reminded to make an appointment with Dr.Barker.   Pharmacotherapy for weight loss: none  Review of Systems:  Pertinent positives were addressed with patient today. Reviewed by clinician on day of visit: allergies, medications, problem list, medical history, surgical history, family history, social history, and previous encounter notes.  Weight Summary and Biometrics   Weight Lost Since Last Visit: 3lb  Weight Gained Since Last Visit: 0lb   Vitals Temp: 98.5 F (36.9 C) BP: 103/66 Pulse Rate: 79 SpO2: 98 %   Anthropometric Measurements Height: 4\' 11"  (1.499 m) Weight: 193 lb (87.5 kg) BMI (Calculated): 38.96 Weight at Last Visit: 196lb Weight Lost Since Last Visit: 3lb Weight Gained Since Last Visit: 0lb Starting Weight: 196lb Total Weight Loss (lbs): 3 lb (1.361 kg) Peak Weight: 205lb Waist Measurement : 43 inches   Body Composition  Body Fat %: 46.8 % Fat Mass (lbs):  90.6 lbs Muscle Mass (lbs): 97.8 lbs Total Body Water (lbs): 78.6 lbs Visceral Fat Rating : 11   Other Clinical Data Fasting: No Labs: no Today's Visit #: 2 Starting Date: 04/12/24 Comments: Second Visit   Objective:   PHYSICAL EXAM:  Blood pressure 103/66, pulse 79, temperature 98.5 F (36.9 C), height 4\' 11"  (1.499 m), weight 193 lb (87.5 kg), SpO2 98%, unknown if currently breastfeeding. Body mass index is 38.98 kg/m.  General: she is overweight, cooperative and in no acute distress.   HEENT: EOMI, sclerae are anicteric. Lungs: Normal breathing effort, no conversational dyspnea. M-Sk:  Normal gross ROM * 4 extremities  PSYCH: Has normal mood, affect and thought process. Neurologic: No gross sensory or motor deficits. Well developed, A and O * 3  DIAGNOSTIC DATA REVIEWED:  BMET    Component Value Date/Time   NA 142 04/12/2024 1032   K 4.9 04/12/2024 1032   CL 104 04/12/2024 1032   CO2 22 04/12/2024 1032   GLUCOSE 83 04/12/2024 1032   GLUCOSE 96 08/15/2015 2321   BUN 7 04/12/2024 1032   CREATININE 0.65 04/12/2024 1032   CALCIUM 9.2 04/12/2024 1032   GFRNONAA >60 08/15/2015 2321   GFRAA >60 08/15/2015 2321   Lab Results  Component Value Date   HGBA1C 5.4 04/12/2024   HGBA1C 5.0 07/01/2018   Lab Results  Component Value Date   INSULIN  7.3 04/12/2024   Lab Results  Component Value Date   TSH 0.761 04/12/2024   CBC    Component Value Date/Time   WBC 7.2 04/12/2024 1032   WBC 14.9 (H) 01/19/2019 0314   RBC 4.45 04/12/2024 1032   RBC 3.99 01/19/2019 0314   HGB 13.1 04/12/2024 1032   HCT 40.8 04/12/2024 1032   PLT 332 04/12/2024 1032   MCV 92 04/12/2024 1032   MCH 29.4 04/12/2024 1032   MCH 29.6 01/19/2019 0314   MCHC 32.1 04/12/2024 1032   MCHC 33.2 01/19/2019 0314   RDW 12.7 04/12/2024 1032   Iron Studies No results found for: "IRON", "TIBC", "FERRITIN", "IRONPCTSAT" Lipid Panel     Component Value Date/Time   CHOL 166 04/12/2024 1032    TRIG 56 04/12/2024 1032   HDL 51 04/12/2024 1032   CHOLHDL 3.3 04/12/2024 1032   LDLCALC 104 (H) 04/12/2024 1032   Hepatic Function Panel     Component Value Date/Time   PROT 7.1 04/12/2024 1032   ALBUMIN 4.1 04/12/2024 1032   AST 15 04/12/2024 1032   ALT 14 04/12/2024 1032   ALKPHOS 115 04/12/2024 1032   BILITOT 0.4 04/12/2024  1032      Component Value Date/Time   TSH 0.761 04/12/2024 1032   Nutritional Lab Results  Component Value Date   VD25OH 20.7 (L) 04/12/2024    Attestations:   I, Special Puri, acting as a Stage manager for [employer], DO., have compiled all relevant documentation for today's office visit on behalf of Marceil Sensor, DO, while in the presence of [employer], DO.  I have spent 41 minutes in the care of the patient today. Specifically: 1 minute was spent before the visit reviewing the chart. 31 minutes was spent on face-to-face counseling and reviewing listed medical problems above as outlined in office visit note, providing nutritional and behavioral counseling as outlined in obesity care plan, independently interpreting results and goals of care, see listed medical problems, and discussing biometric information and progress. We reviewed her meal plan and discussed how the foods she's eating is affecting each one of her labs. Pt educated on why we want her to eat various foods in various amounts and has a better understanding of the nutritional plan because of this. All her questions were answered today. 9 minutes was spent  after the visit on additional documentation and chart review.   I have reviewed the above documentation for accuracy and completeness, and I agree with the above. Alice Humphrey, D.O.  The 21st Century Cures Act was signed into law in 2016 which includes the topic of electronic health records.  This provides immediate access to information in MyChart.  This includes consultation notes, operative notes, office notes, lab  results and pathology reports.  If you have any questions about what you read please let us  know at your next visit so we can discuss your concerns and take corrective action if need be.  We are right here with you.

## 2024-05-02 ENCOUNTER — Telehealth (INDEPENDENT_AMBULATORY_CARE_PROVIDER_SITE_OTHER): Admitting: Psychology

## 2024-05-12 ENCOUNTER — Ambulatory Visit (INDEPENDENT_AMBULATORY_CARE_PROVIDER_SITE_OTHER): Admitting: Family Medicine

## 2024-05-12 ENCOUNTER — Encounter (INDEPENDENT_AMBULATORY_CARE_PROVIDER_SITE_OTHER): Payer: Self-pay | Admitting: Family Medicine

## 2024-05-12 VITALS — BP 109/70 | HR 65 | Temp 98.2°F | Ht 59.0 in | Wt 191.0 lb

## 2024-05-12 DIAGNOSIS — Z6838 Body mass index (BMI) 38.0-38.9, adult: Secondary | ICD-10-CM

## 2024-05-12 DIAGNOSIS — E66813 Obesity, class 3: Secondary | ICD-10-CM

## 2024-05-12 DIAGNOSIS — E559 Vitamin D deficiency, unspecified: Secondary | ICD-10-CM | POA: Diagnosis not present

## 2024-05-12 DIAGNOSIS — Z6841 Body Mass Index (BMI) 40.0 and over, adult: Secondary | ICD-10-CM

## 2024-05-12 DIAGNOSIS — E538 Deficiency of other specified B group vitamins: Secondary | ICD-10-CM | POA: Insufficient documentation

## 2024-05-12 NOTE — Assessment & Plan Note (Signed)
 Discussed importance of vitamin d  supplementation.  Vitamin d  supplementation has been shown to decrease fatigue, decrease risk of progression to insulin  resistance and then prediabetes, decreases risk of falling in older age and can even assist in decreasing depressive symptoms in PTSD.   Patient to continue current prescription strength Vitamin D .

## 2024-05-12 NOTE — Assessment & Plan Note (Signed)
 Patient started a b complex vitamin to manage her b12 deficiency.  Improving fatigue.  Follow up on repeat labs in 3 months.

## 2024-05-12 NOTE — Progress Notes (Signed)
   SUBJECTIVE:  Chief Complaint: Obesity  Interim History: patient went on vacation since last appointment so she was eating out more and walking more and was hopeful that helped with her weight.  She went to [facility].  For Memorial Day weekend she doesn't have anything planned aside from her daughter's dance recital.  She does not have anything planned for the beginning of June.  Tending to crave chips or salty crunchy options prior to bed.   Loise is here to discuss her progress with her obesity treatment plan. She is on the Category 1 Plan and states she is following her eating plan approximately 70 % of the time. She states she is exercising 15 minutes 3 times per week.   OBJECTIVE: Visit Diagnoses: Problem List Items Addressed This Visit       Other   BMI 38.0-38.9,adult   Vitamin D  deficiency   Discussed importance of vitamin d  supplementation.  Vitamin d  supplementation has been shown to decrease fatigue, decrease risk of progression to insulin  resistance and then prediabetes, decreases risk of falling in older age and can even assist in decreasing depressive symptoms in PTSD.   Patient to continue current prescription strength Vitamin D .        B12 deficiency   Patient started a b complex vitamin to manage her b12 deficiency.  Improving fatigue.  Follow up on repeat labs in 3 months.      Other Visit Diagnoses       Class 3 severe obesity due to excess calories with body mass index (BMI) of 40.0 to 44.9 in adult, unspecified whether serious comorbidity present    -  Primary       Vitals Temp: 98.2 F (36.8 C) BP: 109/70 Pulse Rate: 65 SpO2: 97 %   Anthropometric Measurements Height: 4\' 11"  (1.499 m) Weight: 191 lb (86.6 kg) BMI (Calculated): 38.56 Weight at Last Visit: 193 lb Weight Lost Since Last Visit: 2 lb Starting Weight: 196 lb Total Weight Loss (lbs): 5 lb (2.268 kg) Peak Weight: 205 lb   Body Composition  Body Fat %: 41.8 % Fat Mass (lbs):  79.8 lbs Muscle Mass (lbs): 106.4 lbs Total Body Water (lbs): 80 lbs Visceral Fat Rating : 9   Other Clinical Data Today's Visit #: 3 Starting Date: 04/12/24 Comments: Cat 1     ASSESSMENT AND PLAN:  Diet: Ivory is currently in the action stage of change. As such, her goal is to continue with weight loss efforts and has agreed to the Category 1 Plan.  Discussed how to incorporate chips as a substitution for the bread at lunch today.  Exercise:  All adults should avoid inactivity. Some activity is better than none, and adults who participate in any amount of physical activity, gain some health benefits.  Behavior Modification:  We discussed the following Behavioral Modification Strategies today: increasing lean protein intake, decreasing simple carbohydrates, increasing vegetables, meal planning and cooking strategies, and keeping healthy foods in the home.   No follow-ups on file.   She was informed of the importance of frequent follow up visits to maximize her success with intensive lifestyle modifications for her multiple health conditions.  Attestation Statements:   Reviewed by clinician on day of visit: allergies, medications, problem list, medical history, surgical history, family history, social history, and previous encounter notes.     Donaciano Frizzle, MD

## 2024-05-17 ENCOUNTER — Telehealth: Admitting: Family Medicine

## 2024-05-17 DIAGNOSIS — J029 Acute pharyngitis, unspecified: Secondary | ICD-10-CM | POA: Diagnosis not present

## 2024-05-17 MED ORDER — AMOXICILLIN 500 MG PO TABS
500.0000 mg | ORAL_TABLET | Freq: Two times a day (BID) | ORAL | 0 refills | Status: AC
Start: 2024-05-17 — End: 2024-05-27

## 2024-05-17 MED ORDER — LIDOCAINE VISCOUS HCL 2 % MT SOLN
15.0000 mL | OROMUCOSAL | 0 refills | Status: DC | PRN
Start: 2024-05-17 — End: 2024-09-14

## 2024-05-17 NOTE — Patient Instructions (Signed)
 Alice Humphrey, thank you for joining Lanetta Pion, NP for today's virtual visit.  While this provider is not your primary care provider (PCP), if your PCP is located in our provider database this encounter information will be shared with them immediately following your visit.   A [facility] MyChart account gives you access to today's visit and all your visits, tests, and labs performed at [facility] " click here if you don't have a [facility] MyChart account or go to [email or web address]  Consent: (Patient) Alice Humphrey provided verbal consent for this virtual visit at the beginning of the encounter.  Current Medications:  Current Outpatient Medications:    amoxicillin  (AMOXIL ) 500 MG tablet, Take 1 tablet (500 mg total) by mouth 2 (two) times daily for 10 days., Disp: 20 tablet, Rfl: 0   lidocaine  (XYLOCAINE ) 2 % solution, Use as directed 15 mLs in the mouth or throat as needed for mouth pain., Disp: 100 mL, Rfl: 0   cetirizine  (ZYRTEC ) 10 MG tablet, Take 10 mg by mouth daily., Disp: , Rfl:    cyanocobalamin  (VITAMIN B12) 500 MCG tablet, Take 1 tablet (500 mcg total) by mouth daily., Disp: , Rfl:    hydrOXYzine  (ATARAX /VISTARIL ) 25 MG tablet, Take 25 mg by mouth as needed., Disp: , Rfl:    Vitamin D , Ergocalciferol , (DRISDOL ) 1.25 MG (50000 UNIT) CAPS capsule, Take 1 capsule (50,000 Units total) by mouth every 7 (seven) days., Disp: 4 capsule, Rfl: 1   Medications ordered in this encounter:  Meds ordered this encounter  Medications   amoxicillin  (AMOXIL ) 500 MG tablet    Sig: Take 1 tablet (500 mg total) by mouth 2 (two) times daily for 10 days.    Dispense:  20 tablet    Refill:  0    Supervising Provider:   LAMPTEY, PHILIP O [[id number]]   lidocaine  (XYLOCAINE ) 2 % solution    Sig: Use as directed 15 mLs in the mouth or throat as needed for mouth pain.    Dispense:  100 mL    Refill:  0    Supervising Provider:   Corine Dice [[id number]]     *If you need  refills on other medications prior to your next appointment, please contact your pharmacy*  Follow-Up: Call back or seek an in-person evaluation if the symptoms worsen or if the condition fails to improve as anticipated.  [facility] [phone]  Other Instructions   -Suspect bacterial pharyngitis - Discussed OTC management - Will prescribe Viscous lidocaine  for symptomatic relief, along with ABX  for bacterial cover - May use tylenol  and ibuprofen  alternating every 3-4 hours as needed for pain, fevers, body aches - Salt water gargles - Hydration and voice rest - Seek in person evaluation if worsening or fails to improve     If you have been instructed to have an in-person evaluation today at a local Urgent Care facility, please use the link below. It will take you to a list of all of our available [facility], including address, phone number and hours of operation. Please do not delay care.  [facility]  If you or a family member do not have a primary care provider, use the link below to schedule a visit and establish care. When you choose a [facility] primary care physician or advanced practice provider, you gain a long-term partner in health. Find a Primary Care Provider  Learn more about [facility] in-office and virtual  care options: [facility] - Get Care Now

## 2024-05-17 NOTE — Progress Notes (Signed)
 Virtual Visit Consent   Janeka A [place], you are scheduled for a virtual visit with a [facility] provider today. Just as with appointments in the office, your consent must be obtained to participate. Your consent will be active for this visit and any virtual visit you may have with one of our providers in the next 365 days. If you have a MyChart account, a copy of this consent can be sent to you electronically.  As this is a virtual visit, video technology does not allow for your provider to perform a traditional examination. This may limit your provider's ability to fully assess your condition. If your provider identifies any concerns that need to be evaluated in person or the need to arrange testing (such as labs, EKG, etc.), we will make arrangements to do so. Although advances in technology are sophisticated, we cannot ensure that it will always work on either your end or our end. If the connection with a video visit is poor, the visit may have to be switched to a telephone visit. With either a video or telephone visit, we are not always able to ensure that we have a secure connection.  By engaging in this virtual visit, you consent to the provision of healthcare and authorize for your insurance to be billed (if applicable) for the services provided during this visit. Depending on your insurance coverage, you may receive a charge related to this service.  I need to obtain your verbal consent now. Are you willing to proceed with your visit today? Alice Humphrey has provided verbal consent on 05/17/2024 for a virtual visit (video or telephone). Lanetta Pion, NP  Date: 05/17/2024 10:52 AM   Virtual Visit via Video Note   I, Lanetta Pion, connected with  Alice Humphrey  ([id number], 12/28/1995) on 05/17/24 at 10:45 AM EDT by a video-enabled telemedicine application and verified that I am speaking with the correct person using two identifiers.  Location: Patient: Virtual Visit Location Patient:  Home Provider: Virtual Visit Location Provider: Home Office   I discussed the limitations of evaluation and management by telemedicine and the availability of in person appointments. The patient expressed understanding and agreed to proceed.    History of Present Illness: Alice Humphrey is a 28 y.o. who identifies as a female who was assigned female at birth, and is being seen today for sore throat  Onset of sore throat was yesterday. Has white patches covering both sides of throat. Having cold chills and body aches.  Fever of 101- oral (taken while on video) Has tried to use cough drops and medication. Does not have known sick contacts.  Denies congestion, runny nose, ear pain, fevers   Problems:  Patient Active Problem List   Diagnosis Date Noted   B12 deficiency 05/12/2024   Vitamin D  deficiency    Supervision of normal pregnancy in first trimester 03/21/2022   BMI 38.0-38.9,adult 02/16/2019   Oral contraceptive prescribed 02/16/2019    Allergies: No Known Allergies Medications:  Current Outpatient Medications:    cetirizine  (ZYRTEC ) 10 MG tablet, Take 10 mg by mouth daily., Disp: , Rfl:    cyanocobalamin  (VITAMIN B12) 500 MCG tablet, Take 1 tablet (500 mcg total) by mouth daily., Disp: , Rfl:    hydrOXYzine  (ATARAX /VISTARIL ) 25 MG tablet, Take 25 mg by mouth as needed., Disp: , Rfl:    Vitamin D , Ergocalciferol , (DRISDOL ) 1.25 MG (50000 UNIT) CAPS capsule, Take 1 capsule (50,000 Units total) by mouth every 7 (seven) days.,  Disp: 4 capsule, Rfl: 1  Observations/Objective: Patient is well-developed, well-nourished in no acute distress.  Resting comfortably  at home.  Head is normocephalic, atraumatic.  No labored breathing.  Speech is clear and coherent with logical content.  Patient is alert and oriented at baseline.    Assessment and Plan:  1. Pharyngitis, unspecified etiology (Primary)  - amoxicillin (AMOXIL) 500 MG tablet; Take 1 tablet (500 mg total) by mouth 2  (two) times daily for 10 days.  Dispense: 20 tablet; Refill: 0 - lidocaine  (XYLOCAINE ) 2 % solution; Use as directed 15 mLs in the mouth or throat as needed for mouth pain.  Dispense: 100 mL; Refill: 0   -Suspect bacterial pharyngitis - Discussed OTC management - Will prescribe Viscous lidocaine  for symptomatic relief, along with ABX for bacterial cover - May use tylenol  and ibuprofen  alternating every 3-4 hours as needed for pain, fevers, body aches - Salt water gargles - Hydration and voice rest - Seek in person evaluation if worsening or fails to improve    Reviewed side effects, risks and benefits of medication.    Patient acknowledged agreement and understanding of the plan.   Past Medical, Surgical, Social History, Allergies, and Medications have been Reviewed.    Follow Up Instructions: I discussed the assessment and treatment plan with the patient. The patient was provided an opportunity to ask questions and all were answered. The patient agreed with the plan and demonstrated an understanding of the instructions.  A copy of instructions were sent to the patient via MyChart unless otherwise noted below.    The patient was advised to call back or seek an in-person evaluation if the symptoms worsen or if the condition fails to improve as anticipated.    Lanetta Pion, NP

## 2024-05-24 ENCOUNTER — Telehealth (INDEPENDENT_AMBULATORY_CARE_PROVIDER_SITE_OTHER): Payer: Self-pay | Admitting: Psychology

## 2024-05-24 ENCOUNTER — Telehealth (INDEPENDENT_AMBULATORY_CARE_PROVIDER_SITE_OTHER): Admitting: Psychology
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
## 2024-05-24 NOTE — Telephone Encounter (Signed)
  Office: [phone]  /  Fax: [phone]  Date of Call: May 24, 2024  Provider: Catherene Close, PsyD  CONTENT: This provider called Zemira to check-in as she did not present for today's MyChart Video Visit appointment. A HIPAA compliant voicemail was left requesting a call back. Of note, this provider stayed on the MyChart Video Visit appointment for 5 minutes prior to signing off per the clinic's grace period policy.    PLAN: This provider will wait for Sinaya to call back. No further follow-up planned by this provider.

## 2024-06-16 ENCOUNTER — Encounter (INDEPENDENT_AMBULATORY_CARE_PROVIDER_SITE_OTHER): Payer: Self-pay | Admitting: Family Medicine

## 2024-06-16 ENCOUNTER — Ambulatory Visit (INDEPENDENT_AMBULATORY_CARE_PROVIDER_SITE_OTHER): Admitting: Family Medicine

## 2024-06-16 VITALS — BP 95/65 | HR 97 | Temp 98.7°F | Ht 59.0 in | Wt 183.0 lb

## 2024-06-16 DIAGNOSIS — E538 Deficiency of other specified B group vitamins: Secondary | ICD-10-CM | POA: Diagnosis not present

## 2024-06-16 DIAGNOSIS — E669 Obesity, unspecified: Secondary | ICD-10-CM

## 2024-06-16 DIAGNOSIS — E559 Vitamin D deficiency, unspecified: Secondary | ICD-10-CM

## 2024-06-16 DIAGNOSIS — Z6836 Body mass index (BMI) 36.0-36.9, adult: Secondary | ICD-10-CM

## 2024-06-16 DIAGNOSIS — R7303 Prediabetes: Secondary | ICD-10-CM

## 2024-06-16 MED ORDER — VITAMIN D (ERGOCALCIFEROL) 1.25 MG (50000 UNIT) PO CAPS
50000.0000 [IU] | ORAL_CAPSULE | ORAL | 1 refills | Status: DC
Start: 2024-06-16 — End: 2024-08-02

## 2024-06-16 NOTE — Progress Notes (Signed)
 Alice Humphrey, D.O.  ABFM, ABOM Specializing in Clinical Bariatric Medicine  Office located at: [address] [place], [place]  [address]   Assessment and Plan:   Medications Discontinued During This Encounter  Medication Reason   Vitamin D , Ergocalciferol , (DRISDOL ) 1.25 MG (50000 UNIT) CAPS capsule Reorder    Meds ordered this encounter  Medications   Vitamin D , Ergocalciferol , (DRISDOL ) 1.25 MG (50000 UNIT) CAPS capsule    Sig: Take 1 capsule (50,000 Units total) by mouth every 7 (seven) days.    Dispense:  4 capsule    Refill:  1      FOR THE DISEASE OF OBESITY: Obesity BMI Starting 40.17 (HCC) BMI 36.0-36.9, adult -- Current BMI 36.96 Assessment & Plan: Since last office visit with Dr. Elio on 05/12/24 patient's muscle mass has decreased by 6.6 lbs. Fat mass has decreased by 1.2 lbs. Total body water has decreased by 0.4 lb.  Counseling done on how various foods will affect these numbers and how to maximize success  Total lbs lost to date: 13 lbs  Total weight loss percentage to date: -6.63%     Recommended Dietary Goals [name] is currently in the action stage of change. As such, her goal is to continue weight management plan.  She has agreed to: continue current plan   Behavioral Intervention We discussed the following today: increasing lean protein intake to established goals, increasing lower glycemic fruits, increasing water intake , practice mindfulness eating and understand the difference between hunger signals and cravings, avoiding temptations and identifying enticing environmental cues, better snacking choices, and staying on track while traveling and vacationing  Additional resources provided today: Handout on Examples of Low Glycemic Index and Low Calorie Fruits & Vegetables   Evidence-based interventions for health behavior change were utilized today including the discussion of self monitoring techniques, problem-solving barriers and SMART  goal setting techniques.   Regarding patient's less desirable eating habits and patterns, we employed the technique of small changes.   Pt will specifically work on: Remove tempting unhealthy foods/snacks from the home for next visit.    Recommended Physical Activity Goals [name] has been advised to work up to 300-450 minutes of moderate intensity aerobic activity a week and strengthening exercises 2-3 times per week for cardiovascular health, weight loss maintenance and preservation of muscle mass.   She has agreed to: Increase physical activity in their day and reduce sedentary time (increase NEAT).   Pharmacotherapy We both agreed to: Continue with current nutritional and behavioral strategies   ASSOCIATED CONDITIONS ADDRESSED TODAY: Vitamin D  deficiency Assessment & Plan: Lab Results  Component Value Date   VD25OH 20.7 (L) 04/12/2024   Compliant with ERGO 50K units once weekly. Tolerating well with no adverse side effects. Counseled pt on benefits of vitamin D  supplementation. Continue with current supplementation as prescribed. Will be refilling ERGO today.     B12 deficiency Assessment & Plan: Lab Results  Component Value Date   VITAMINB12 425 04/12/2024   Compliant with B12 500 mcg once daily. Tolerating well, no SE. Has also been buying a drink at [employer] that contains B12. Continue supplementation as prescribed. Reminded pt of importance of compliance to supplements. Recheck vit B12 levels periodically.     Prediabetes Assessment & Plan: Lab Results  Component Value Date   HGBA1C 5.4 04/12/2024   HGBA1C 5.1 04/01/2022   HGBA1C 5.0 07/01/2018   INSULIN  7.3 04/12/2024    No meds currently; diet/lifestyle approach. Hunger and cravings are  currently uncontrolled. She reports feeling hungry surely after eating. She recently struggled with following her meal plan while sick and out of town visiting family, however, she's now dedicated to getting back on  track.  Reviewed strategies to avoid temptations of unhealthy food, including removing all unhealthy foods and snacks from the home. Decrease simple carb/sugar intake, decrease consumption of processed foods, and increase protein intake.Also encouraged patient to start regular exercise throughout the week. Will continue to monitor alongside PCP as it relates to her weight loss journey.    Follow up:   Return in about 4 weeks (around 07/14/2024). She was informed of the importance of frequent follow up visits to maximize her success with intensive lifestyle modifications for her multiple health conditions.  Subjective:   Chief complaint: Obesity [name] is here to discuss her progress with her obesity treatment plan. She is on the Category 1 Plan and states she is following her eating plan approximately 50% of the time. She states she is not exercising   Interval History:  [name] is here for a follow up office visit. Since last OV with Dr. Elio on 05/12/24, she is down 8 lbs. Yvetta reports recent challenges with staying on track, including recent illness and being on vacation visiting family. During this time she states she gained weight. She admits to having difficulty resuming her meal plan afterwards. She notes that hunger and cravings have been difficult to manage, often feeling hungry shortly after eating. She's currently working on finding healthier dessert alternatives. Recently she has been eating salads with protein and adds protein chips as croutons.  Pharmacotherapy for weight loss: She is currently taking no anti-obesity medication.   Review of Systems:  Pertinent positives were addressed with patient today.  Reviewed by clinician on day of visit: allergies, medications, problem list, medical history, surgical history, family history, social history, and previous encounter notes.  Weight Summary and Biometrics   Weight Lost Since Last Visit: 8lb  Weight Gained Since  Last Visit: 0lb   Vitals Temp: 98.7 F (37.1 C) BP: 95/65 Pulse Rate: 97 SpO2: 98 %   Anthropometric Measurements Height: 4' 11 (1.499 m) Weight: 183 lb (83 kg) BMI (Calculated): 36.94 Weight at Last Visit: 191lb Weight Lost Since Last Visit: 8lb Weight Gained Since Last Visit: 0lb Starting Weight: 196lb Total Weight Loss (lbs): 12 lb (5.443 kg) Peak Weight: 205lb   Body Composition  Body Fat %: 42.8 % Fat Mass (lbs): 78.6 lbs Muscle Mass (lbs): 99.8 lbs Total Body Water (lbs): 79.6 lbs Visceral Fat Rating : 9   Other Clinical Data Fasting: No Labs: no Today's Visit #: 4 Starting Date: 04/12/24 Comments: Cat1    Objective:   PHYSICAL EXAM: Blood pressure 95/65, pulse 97, temperature 98.7 F (37.1 C), height 4' 11 (1.499 m), weight 183 lb (83 kg), SpO2 98%, unknown if currently breastfeeding. Body mass index is 36.96 kg/m.  General: she is overweight, cooperative and in no acute distress. PSYCH: Has normal mood, affect and thought process.   HEENT: EOMI, sclerae are anicteric. Lungs: Normal breathing effort, no conversational dyspnea. Extremities: Moves * 4 Neurologic: A and O * 3, good insight  DIAGNOSTIC DATA REVIEWED: BMET    Component Value Date/Time   NA 142 04/12/2024 1032   K 4.9 04/12/2024 1032   CL 104 04/12/2024 1032   CO2 22 04/12/2024 1032   GLUCOSE 83 04/12/2024 1032   GLUCOSE 96 08/15/2015 2321   BUN 7 04/12/2024 1032   CREATININE  0.65 04/12/2024 1032   CALCIUM 9.2 04/12/2024 1032   GFRNONAA >60 08/15/2015 2321   GFRAA >60 08/15/2015 2321   Lab Results  Component Value Date   HGBA1C 5.4 04/12/2024   HGBA1C 5.0 07/01/2018   Lab Results  Component Value Date   INSULIN  7.3 04/12/2024   Lab Results  Component Value Date   TSH 0.761 04/12/2024   CBC    Component Value Date/Time   WBC 7.2 04/12/2024 1032   WBC 14.9 (H) 01/19/2019 0314   RBC 4.45 04/12/2024 1032   RBC 3.99 01/19/2019 0314   HGB 13.1 04/12/2024 1032    HCT 40.8 04/12/2024 1032   PLT 332 04/12/2024 1032   MCV 92 04/12/2024 1032   MCH 29.4 04/12/2024 1032   MCH 29.6 01/19/2019 0314   MCHC 32.1 04/12/2024 1032   MCHC 33.2 01/19/2019 0314   RDW 12.7 04/12/2024 1032   Iron Studies No results found for: IRON, TIBC, FERRITIN, IRONPCTSAT Lipid Panel     Component Value Date/Time   CHOL 166 04/12/2024 1032   TRIG 56 04/12/2024 1032   HDL 51 04/12/2024 1032   CHOLHDL 3.3 04/12/2024 1032   LDLCALC 104 (H) 04/12/2024 1032   Hepatic Function Panel     Component Value Date/Time   PROT 7.1 04/12/2024 1032   ALBUMIN 4.1 04/12/2024 1032   AST 15 04/12/2024 1032   ALT 14 04/12/2024 1032   ALKPHOS 115 04/12/2024 1032   BILITOT 0.4 04/12/2024 1032      Component Value Date/Time   TSH 0.761 04/12/2024 1032   Nutritional Lab Results  Component Value Date   VD25OH 20.7 (L) 04/12/2024    Attestations:   LILLETTE Vernell Forest, acting as a medical scribe for Alice Jenkins, DO., have compiled all relevant documentation for today's office visit on behalf of Alice Jenkins, DO, while in the presence of [employer], DO.  Reviewed by clinician on day of visit: allergies, medications, problem list, medical history, surgical history, family history, social history, and previous encounter notes pertinent to patient's obesity diagnosis.  I have reviewed the above documentation for accuracy and completeness, and I agree with the above. Alice JINNY Humphrey, D.O.  The 21st Century Cures Act was signed into law in 2016 which includes the topic of electronic health records.  This provides immediate access to information in MyChart.  This includes consultation notes, operative notes, office notes, lab results and pathology reports.  If you have any questions about what you read please let us  know at your next visit so we can discuss your concerns and take corrective action if need be.  We are right here with you.

## 2024-07-07 ENCOUNTER — Telehealth (INDEPENDENT_AMBULATORY_CARE_PROVIDER_SITE_OTHER): Payer: Self-pay | Admitting: Family Medicine

## 2024-07-07 ENCOUNTER — Encounter (INDEPENDENT_AMBULATORY_CARE_PROVIDER_SITE_OTHER): Payer: Self-pay | Admitting: Family Medicine

## 2024-07-07 ENCOUNTER — Ambulatory Visit (INDEPENDENT_AMBULATORY_CARE_PROVIDER_SITE_OTHER): Admitting: Family Medicine

## 2024-07-07 VITALS — BP 106/72 | HR 72 | Temp 98.7°F | Ht 59.0 in | Wt 182.0 lb

## 2024-07-07 DIAGNOSIS — R7303 Prediabetes: Secondary | ICD-10-CM | POA: Diagnosis not present

## 2024-07-07 DIAGNOSIS — E669 Obesity, unspecified: Secondary | ICD-10-CM | POA: Diagnosis not present

## 2024-07-07 DIAGNOSIS — E559 Vitamin D deficiency, unspecified: Secondary | ICD-10-CM

## 2024-07-07 DIAGNOSIS — L83 Acanthosis nigricans: Secondary | ICD-10-CM

## 2024-07-07 DIAGNOSIS — Z6836 Body mass index (BMI) 36.0-36.9, adult: Secondary | ICD-10-CM

## 2024-07-07 MED ORDER — METFORMIN HCL 500 MG PO TABS
ORAL_TABLET | ORAL | 0 refills | Status: DC
Start: 2024-07-07 — End: 2024-08-02

## 2024-07-07 NOTE — Progress Notes (Signed)
 Alice Humphrey, D.O.  ABFM, ABOM Specializing in Clinical Bariatric Medicine  Office located at: [address] [place], [place]  [address]   Assessment and Plan:   Meds ordered this encounter  Medications   metFORMIN  (GLUCOPHAGE ) 500 MG tablet    Sig: 1 po with lunch daily    Dispense:  30 tablet    Refill:  0    30 d supply;  ** OV for RF **   Do not send RF request      FOR THE DISEASE OF OBESITY:  Obesity BMI Starting 40.17 (HCC) BMI 36.0-36.9,adult current 36.74 Assessment & Plan: Since last office visit on 06/16/24 patient's muscle mass has decreased by 0.8 lbs. No change to fat mass. Total body water has decreased by 2.2 lbs.  Counseling done on how various foods will affect these numbers and how to maximize success  Total lbs lost to date: 14 lbs Total weight loss percentage to date: -7.14 %   Recommended Dietary Goals Alice Humphrey is currently in the action stage of change. As such, her goal is to continue weight management plan.  She has agreed to: continue current plan   Behavioral Intervention We discussed the following today: increasing lean protein intake to established goals, decreasing simple carbohydrates , increasing water intake , decreasing eating out or consumption of processed foods, and making healthy choices when eating convenient foods, and avoiding temptations and identifying enticing environmental cues  Additional resources provided today: Handout on risks/ benefits of metformin  and associated Myths of use and Handout on NEAT.   Evidence-based interventions for health behavior change were utilized today including the discussion of self monitoring techniques, problem-solving barriers and SMART goal setting techniques.   Regarding patient's less desirable eating habits and patterns, we employed the technique of small changes.   Pt will specifically work on: n/a   Recommended Physical Activity Goals [name] has been advised to work up to  300-450 minutes of moderate intensity aerobic activity a week and strengthening exercises 2-3 times per week for cardiovascular health, weight loss maintenance and preservation of muscle mass.   She has agreed to: Increase physical activity in their day and reduce sedentary time (increase NEAT). and gradually start the amount and intensity of exercise.    Pharmacotherapy We both agreed to: Continue with current nutritional and behavioral strategies and start Metformin  (see below).    ASSOCIATED CONDITIONS ADDRESSED TODAY:  Prediabetes Assessment & Plan: Lab Results  Component Value Date   HGBA1C 5.4 04/12/2024   HGBA1C 5.1 04/01/2022   HGBA1C 5.0 07/01/2018   INSULIN  7.3 04/12/2024    Not on any meds; diet/lifestyle approach. Endorses persistent carb cravings. She has removed tempting foods from her home, but has felt these cravings when visiting family member's homes. Pt is on B12 supplementation. Was previously on Metformin  in childhood.   Mutually agreed to start pt on Metformin . Reviewed risks/benefits. Metformin  can cause decease in B12 levels; continue B12 supplementation. Start taking a 1/2 tab. After 4 days of tolerating well, increase to either a full tab at lunch OR a half tab BID with lunch and dinner. Pt verbalized understanding/agreement. Encouraged pt to eat on plan to avoid potential SE and start exercising.     Vitamin D  deficiency Assessment & Plan: Lab Results  Component Value Date   VD25OH 20.7 (L) 04/12/2024   Compliant ERGO 50K units once weekly. Tolerating well, no SE. Continue with ERGO, no changes. No refill required. Will recheck 3-4 months from  last checked.     Acanthosis nigricans Assessment & Plan: She has a darker pigment in bilateral axilla region as well as the posterior crease of her neck. She confirms that obesity has been a problem since childhood. Advised pt to discuss with PCP about possible derm referral.   Follow up:   Return for f/u  on 08/02/2024 at 11:20 AM. She was informed of the importance of frequent follow up visits to maximize her success with intensive lifestyle modifications for her multiple health conditions.  Subjective:   Chief complaint: Obesity [name] is here to discuss her progress with her obesity treatment plan. She is on the Category 1 Plan and states she is following her eating plan approximately 50% of the time. She states she is not exercising.  Interval History:  Alice Humphrey is here for a follow up office visit. Since last OV on 06/16/24, she is down 1 lb. She has started to be more mindful of the foods she is eating. Has been prioritizing her lean protein intake in the last 3 weeks. Endorses carb cravings. Reports eating out more in the last few weeks. She has removed tempting unhealthy foods/snacks from her home.   Pharmacotherapy that aid with weight loss: She is currently taking no anti-obesity medication.   Review of Systems:  Pertinent positives were addressed with patient today.  Reviewed by clinician on day of visit: allergies, medications, problem list, medical history, surgical history, family history, social history, and previous encounter notes.  Weight Summary and Biometrics   Weight Lost Since Last Visit: 1lb  Weight Gained Since Last Visit: 0    Vitals Temp: 98.7 F (37.1 C) BP: 106/72 Pulse Rate: 72 SpO2: 98 %   Anthropometric Measurements Height: 4' 11 (1.499 m) Weight: 182 lb (82.6 kg) BMI (Calculated): 36.74 Weight at Last Visit: 183lb Weight Lost Since Last Visit: 1lb Weight Gained Since Last Visit: 0 Starting Weight: 196lb Total Weight Loss (lbs): 14 lb (6.35 kg) Peak Weight: 205lb   Body Composition  Body Fat %: 43 % Fat Mass (lbs): 78.6 lbs Muscle Mass (lbs): 99 lbs Total Body Water (lbs): 77.4 lbs Visceral Fat Rating : 9   Other Clinical Data Fasting: no Labs: no Today's Visit #: 5 Starting Date: 04/12/24 Comments: Cat 1    Objective:    PHYSICAL EXAM: Blood pressure 106/72, pulse 72, temperature 98.7 F (37.1 C), height 4' 11 (1.499 m), weight 182 lb (82.6 kg), last menstrual period 06/30/2024, SpO2 98%, unknown if currently breastfeeding. Body mass index is 36.76 kg/m.  General: she is overweight, cooperative and in no acute distress. PSYCH: Has normal mood, affect and thought process.   HEENT: EOMI, sclerae are anicteric. Lungs: Normal breathing effort, no conversational dyspnea. Extremities: Moves * 4 Neurologic: A and O * 3, good insight  DIAGNOSTIC DATA REVIEWED: BMET    Component Value Date/Time   NA 142 04/12/2024 1032   K 4.9 04/12/2024 1032   CL 104 04/12/2024 1032   CO2 22 04/12/2024 1032   GLUCOSE 83 04/12/2024 1032   GLUCOSE 96 08/15/2015 2321   BUN 7 04/12/2024 1032   CREATININE 0.65 04/12/2024 1032   CALCIUM 9.2 04/12/2024 1032   GFRNONAA >60 08/15/2015 2321   GFRAA >60 08/15/2015 2321   Lab Results  Component Value Date   HGBA1C 5.4 04/12/2024   HGBA1C 5.0 07/01/2018   Lab Results  Component Value Date   INSULIN  7.3 04/12/2024   Lab Results  Component Value Date   TSH  0.761 04/12/2024   CBC    Component Value Date/Time   WBC 7.2 04/12/2024 1032   WBC 14.9 (H) 01/19/2019 0314   RBC 4.45 04/12/2024 1032   RBC 3.99 01/19/2019 0314   HGB 13.1 04/12/2024 1032   HCT 40.8 04/12/2024 1032   PLT 332 04/12/2024 1032   MCV 92 04/12/2024 1032   MCH 29.4 04/12/2024 1032   MCH 29.6 01/19/2019 0314   MCHC 32.1 04/12/2024 1032   MCHC 33.2 01/19/2019 0314   RDW 12.7 04/12/2024 1032   Iron Studies No results found for: IRON, TIBC, FERRITIN, IRONPCTSAT Lipid Panel     Component Value Date/Time   CHOL 166 04/12/2024 1032   TRIG 56 04/12/2024 1032   HDL 51 04/12/2024 1032   CHOLHDL 3.3 04/12/2024 1032   LDLCALC 104 (H) 04/12/2024 1032   Hepatic Function Panel     Component Value Date/Time   PROT 7.1 04/12/2024 1032   ALBUMIN 4.1 04/12/2024 1032   AST 15 04/12/2024  1032   ALT 14 04/12/2024 1032   ALKPHOS 115 04/12/2024 1032   BILITOT 0.4 04/12/2024 1032      Component Value Date/Time   TSH 0.761 04/12/2024 1032   Nutritional Lab Results  Component Value Date   VD25OH 20.7 (L) 04/12/2024    Attestations:   LILLETTE Vernell Forest, acting as a medical scribe for Alice Jenkins, DO., have compiled all relevant documentation for today's office visit on behalf of Alice Jenkins, DO, while in the presence of [employer], DO.  I have reviewed the above documentation for accuracy and completeness, and I agree with the above. Alice JINNY Humphrey, D.O.  The 21st Century Cures Act was signed into law in 2016 which includes the topic of electronic health records.  This provides immediate access to information in MyChart.  This includes consultation notes, operative notes, office notes, lab results and pathology reports.  If you have any questions about what you read please let us  know at your next visit so we can discuss your concerns and take corrective action if need be.  We are right here with you.

## 2024-07-07 NOTE — Telephone Encounter (Signed)
 Hello!   She called because her pharmacy hasn't received her prescription. She just left but I told her I would put ina noe as a courtesy.   Thank!

## 2024-08-02 ENCOUNTER — Ambulatory Visit (INDEPENDENT_AMBULATORY_CARE_PROVIDER_SITE_OTHER): Admitting: Family Medicine

## 2024-08-02 ENCOUNTER — Encounter (INDEPENDENT_AMBULATORY_CARE_PROVIDER_SITE_OTHER): Payer: Self-pay | Admitting: Family Medicine

## 2024-08-02 VITALS — BP 119/80 | HR 62 | Temp 98.1°F | Ht 59.0 in | Wt 179.0 lb

## 2024-08-02 DIAGNOSIS — R7303 Prediabetes: Secondary | ICD-10-CM | POA: Diagnosis not present

## 2024-08-02 DIAGNOSIS — Z6836 Body mass index (BMI) 36.0-36.9, adult: Secondary | ICD-10-CM

## 2024-08-02 DIAGNOSIS — E669 Obesity, unspecified: Secondary | ICD-10-CM

## 2024-08-02 DIAGNOSIS — E559 Vitamin D deficiency, unspecified: Secondary | ICD-10-CM | POA: Diagnosis not present

## 2024-08-02 MED ORDER — VITAMIN D (ERGOCALCIFEROL) 1.25 MG (50000 UNIT) PO CAPS
50000.0000 [IU] | ORAL_CAPSULE | ORAL | 0 refills | Status: DC
Start: 2024-08-02 — End: 2024-09-14

## 2024-08-02 MED ORDER — METFORMIN HCL 500 MG PO TABS: ORAL_TABLET | ORAL | 1 refills | Status: DC

## 2024-08-02 NOTE — Progress Notes (Signed)
 Alice Humphrey, D.O.  ABFM, ABOM Specializing in Clinical Bariatric Medicine  Office located at: [address] [place], [place]  [address]    Assessment and Plan:   Medications Discontinued During This Encounter  Medication Reason   Vitamin D , Ergocalciferol , (DRISDOL ) 1.25 MG (50000 UNIT) CAPS capsule Reorder   metFORMIN  (GLUCOPHAGE ) 500 MG tablet Reorder     Meds ordered this encounter  Medications   metFORMIN  (GLUCOPHAGE ) 500 MG tablet    Sig: 1 po with twice daily with a meal    Dispense:  60 tablet    Refill:  1    30 d supply;  ** OV for RF **   Do not send RF request   Vitamin D , Ergocalciferol , (DRISDOL ) 1.25 MG (50000 UNIT) CAPS capsule    Sig: Take 1 capsule (50,000 Units total) by mouth every 7 (seven) days.    Dispense:  4 capsule    Refill:  0     Recheck fasting labs next OV   FOR THE DISEASE OF OBESITY:  BMI 36.0-36.9,adult current 36.13 Obesity BMI Starting 39.59 [facility]) Date 04/12/24 Assessment & Plan: Since last office visit on 07/07/2024 patient's muscle mass has increased by 0.2 lbs. Fat mass has decreased by 3.4 lbs. Total body water has decreased by .4 lbs.  Body fat % has decreased by 1.2 %. Counseling done on how various foods will affect these numbers and how to maximize success  Total lbs lost to date: -17 lbs Total weight loss percentage to date: - 8.67 %   Recommended Dietary Goals Alice Humphrey is currently in the action stage of change. As such, her goal is to continue weight management plan.  She has agreed to: continue current plan   Behavioral Intervention We discussed the following today: continue to work on maintaining a reduced calorie state, getting the recommended amount of protein, incorporating whole foods, making healthy choices, staying well hydrated and practicing mindfulness when eating.  Additional resources provided today: None  Evidence-based interventions for health behavior change were utilized today including the  discussion of self monitoring techniques, problem-solving barriers and SMART goal setting techniques.   Regarding patient's less desirable eating habits and patterns, we employed the technique of small changes.   Pt will specifically work on: n/a    Recommended Physical Activity Goals Alice Humphrey has been advised to work up to 300-450 minutes of moderate intensity aerobic activity a week and strengthening exercises 2-3 times per week for cardiovascular health, weight loss maintenance and preservation of muscle mass.   She has agreed to: Think about enjoyable ways to increase daily physical activity and overcoming barriers to exercise   Pharmacotherapy See Pre-DM note.   ASSOCIATED CONDITIONS ADDRESSED TODAY:   Prediabetes Assessment & Plan: Lab Results  Component Value Date   HGBA1C 5.4 04/12/2024   HGBA1C 5.1 04/01/2022   HGBA1C 5.0 07/01/2018   INSULIN  7.3 04/12/2024    On Metformin  500 mg at lunch with reported good compliance. She experienced mild GI upset when eating off plan foods but is otherwise tolerating it well. She feels the Metformin  is helping abate her sweet cravings and preventing her from over-eating.  - Shared decision making: INCREASE Metformin  to 500 mg twice daily to improve I.R and aid with weight loss efforts; Reminded her the more she eats off-plan, the more likely for her to have side effects of the drug.   - Continue balanced diet focusing on protein, fruits, and vegetables while limiting simple carbohydrates.  -  Recheck labs as deemed clinically necessary.      Vitamin D  deficiency Assessment & Plan: Lab Results  Component Value Date   VD25OH 20.7 (L) 04/12/2024   Currently on Ergocalciferol  50,000 units weekly with good compliance and tolerance. No acute concerns.   - Continue regimen and weight loss efforts.  - Recheck as deemed medically necessary.     Follow up:   Return 09/14/2024 8:40 AM.  She was informed of the importance of frequent  follow up visits to maximize her success with intensive lifestyle modifications for her multiple health conditions.   Subjective:   Chief complaint: Obesity Alice Humphrey is here to discuss her progress with her obesity treatment plan. She is on the Category 1 Plan and states she is following her eating plan approximately 50% of the time. She states she is not exercising, but walked a lot during her vacation.    Interval History:  Alice Humphrey is here for a follow up office visit. She is accompanied by her daughter. Since last OV on 07/07/2024 , Alice Humphrey is down 3 lbs. She recently returned from a pleasant trip to [place] . She ate some off-plan foods during the trip. In general, she has been more intentional about getting in her lean proteins.    Pharmacotherapy that aid with weight loss: She is currently taking Metformin  500 mg daily.    Review of Systems:  Pertinent positives were addressed with patient today.  Reviewed by clinician on day of visit: allergies, medications, problem list, medical history, surgical history, family history, social history, and previous encounter notes.  Weight Summary and Biometrics    Weight Lost Since Last Visit: 3lb  Weight Gained Since Last Visit: 0  Vitals Temp: 98.1 F (36.7 C) BP: 119/80 Pulse Rate: 62 SpO2: 99 %   Anthropometric Measurements Height: 4' 11 (1.499 m) Weight: 179 lb (81.2 kg) BMI (Calculated): 36.13 Weight at Last Visit: 182lb Weight Lost Since Last Visit: 3lb Weight Gained Since Last Visit: 0 Starting Weight: 196lb Total Weight Loss (lbs): 17 lb (7.711 kg) Peak Weight: 205lb   Body Composition  Body Fat %: 41.8 % Fat Mass (lbs): 75.2 lbs Muscle Mass (lbs): 99.2 lbs Total Body Water (lbs): 77 lbs Visceral Fat Rating : 9   Other Clinical Data Fasting: no Labs: no Today's Visit #: 6 Starting Date: 04/12/24    Objective:   PHYSICAL EXAM: Blood pressure 119/80, pulse 62, temperature 98.1 F (36.7 C),  height 4' 11 (1.499 m), weight 179 lb (81.2 kg), last menstrual period 07/28/2024, SpO2 99%, unknown if currently breastfeeding. Body mass index is 36.15 kg/m.  General: she is overweight, cooperative and in no acute distress. PSYCH: Has normal mood, affect and thought process.   HEENT: EOMI, sclerae are anicteric. Lungs: Normal breathing effort, no conversational dyspnea. Extremities: Moves * 4 Neurologic: A and O * 3, good insight  DIAGNOSTIC DATA REVIEWED: BMET    Component Value Date/Time   NA 142 04/12/2024 1032   K 4.9 04/12/2024 1032   CL 104 04/12/2024 1032   CO2 22 04/12/2024 1032   GLUCOSE 83 04/12/2024 1032   GLUCOSE 96 08/15/2015 2321   BUN 7 04/12/2024 1032   CREATININE 0.65 04/12/2024 1032   CALCIUM 9.2 04/12/2024 1032   GFRNONAA >60 08/15/2015 2321   GFRAA >60 08/15/2015 2321   Lab Results  Component Value Date   HGBA1C 5.4 04/12/2024   HGBA1C 5.0 07/01/2018   Lab Results  Component Value Date   INSULIN  7.3 04/12/2024  Lab Results  Component Value Date   TSH 0.761 04/12/2024   CBC    Component Value Date/Time   WBC 7.2 04/12/2024 1032   WBC 14.9 (H) 01/19/2019 0314   RBC 4.45 04/12/2024 1032   RBC 3.99 01/19/2019 0314   HGB 13.1 04/12/2024 1032   HCT 40.8 04/12/2024 1032   PLT 332 04/12/2024 1032   MCV 92 04/12/2024 1032   MCH 29.4 04/12/2024 1032   MCH 29.6 01/19/2019 0314   MCHC 32.1 04/12/2024 1032   MCHC 33.2 01/19/2019 0314   RDW 12.7 04/12/2024 1032   Iron Studies No results found for: IRON, TIBC, FERRITIN, IRONPCTSAT Lipid Panel     Component Value Date/Time   CHOL 166 04/12/2024 1032   TRIG 56 04/12/2024 1032   HDL 51 04/12/2024 1032   CHOLHDL 3.3 04/12/2024 1032   LDLCALC 104 (H) 04/12/2024 1032   Hepatic Function Panel     Component Value Date/Time   PROT 7.1 04/12/2024 1032   ALBUMIN 4.1 04/12/2024 1032   AST 15 04/12/2024 1032   ALT 14 04/12/2024 1032   ALKPHOS 115 04/12/2024 1032   BILITOT 0.4 04/12/2024  1032      Component Value Date/Time   TSH 0.761 04/12/2024 1032   Nutritional Lab Results  Component Value Date   VD25OH 20.7 (L) 04/12/2024    Attestations:   I, Alice Humphrey, acting as a Stage manager for [employer], DO., have compiled all relevant documentation for today's office visit on behalf of Alice Jenkins, DO, while in the presence of [employer], DO.  I have reviewed the above documentation for accuracy and completeness, and I agree with the above. Alice JINNY Humphrey, D.O.  The 21st Century Cures Act was signed into law in 2016 which includes the topic of electronic health records.  This provides immediate access to information in MyChart.  This includes consultation notes, operative notes, office notes, lab results and pathology reports.  If you have any questions about what you read please let us  know at your next visit so we can discuss your concerns and take corrective action if need be.  We are right here with you.

## 2024-09-14 ENCOUNTER — Encounter (INDEPENDENT_AMBULATORY_CARE_PROVIDER_SITE_OTHER): Payer: Self-pay | Admitting: Family Medicine

## 2024-09-14 ENCOUNTER — Ambulatory Visit (INDEPENDENT_AMBULATORY_CARE_PROVIDER_SITE_OTHER): Admitting: Family Medicine

## 2024-09-14 VITALS — BP 102/69 | HR 83 | Temp 98.7°F | Ht 59.0 in | Wt 177.0 lb

## 2024-09-14 DIAGNOSIS — Z6835 Body mass index (BMI) 35.0-35.9, adult: Secondary | ICD-10-CM

## 2024-09-14 DIAGNOSIS — E7849 Other hyperlipidemia: Secondary | ICD-10-CM

## 2024-09-14 DIAGNOSIS — R7303 Prediabetes: Secondary | ICD-10-CM

## 2024-09-14 DIAGNOSIS — E559 Vitamin D deficiency, unspecified: Secondary | ICD-10-CM

## 2024-09-14 DIAGNOSIS — E538 Deficiency of other specified B group vitamins: Secondary | ICD-10-CM | POA: Diagnosis not present

## 2024-09-14 DIAGNOSIS — Z6836 Body mass index (BMI) 36.0-36.9, adult: Secondary | ICD-10-CM

## 2024-09-14 MED ORDER — VITAMIN D (ERGOCALCIFEROL) 1.25 MG (50000 UNIT) PO CAPS: 50000.0000 [IU] | ORAL_CAPSULE | ORAL | 0 refills | Status: DC

## 2024-09-14 MED ORDER — METFORMIN HCL ER 500 MG PO TB24: 1000.0000 mg | ORAL_TABLET | Freq: Every day | ORAL | 0 refills | Status: DC

## 2024-09-14 NOTE — Progress Notes (Signed)
 Alice Humphrey, D.O.  ABFM, ABOM Specializing in Clinical Bariatric Medicine  Office located at: [address] [place], [place]  [address]   Assessment and Plan:   Orders Placed This Encounter  Procedures   Vitamin B12   VITAMIN D  25 Hydroxy (Vit-D Deficiency, Fractures)   Lipid panel   Hemoglobin A1c   Comprehensive metabolic panel with GFR   Insulin , random    Medications Discontinued During This Encounter  Medication Reason   metFORMIN  (GLUCOPHAGE ) 500 MG tablet    lidocaine  (XYLOCAINE ) 2 % solution    Vitamin D , Ergocalciferol , (DRISDOL ) 1.25 MG (50000 UNIT) CAPS capsule Reorder     Meds ordered this encounter  Medications   metFORMIN  (GLUCOPHAGE -XR) 500 MG 24 hr tablet    Sig: Take 2 tablets (1,000 mg total) by mouth daily with lunch.    Dispense:  60 tablet    Refill:  0   Vitamin D , Ergocalciferol , (DRISDOL ) 1.25 MG (50000 UNIT) CAPS capsule    Sig: Take 1 capsule (50,000 Units total) by mouth every 7 (seven) days.    Dispense:  4 capsule    Refill:  0     FOR THE DISEASE OF OBESITY:  Morbid obesity (HCC) start BMI 39.59 BMI 36.0-36.9,adult current 35.73 Assessment & Plan:  08/02/24 16:00 09/14/24 08:00  Muscle Mass (lbs) 99.2 lbs 101.2 lbs  Fat Mass (lbs) 75.2 lbs 70.8 lbs  Total Body Water (lbs) 77 lbs 75.6 lbs  Visceral Fat Rating  9 8   Total lbs lost to date: -19 lbs Total weight loss percentage to date: -9.69 %  Pt is down 6.9% in body fat percentage and up 0.6% in muscle mass since starting 6 months ago!   Recommended Dietary Goals Alice Humphrey is currently in the action stage of change. As such, her goal is to continue weight management plan.  She has agreed to: continue current plan   Behavioral Intervention We discussed the following today: increasing lean protein intake to established goals and staying on track while eating out.  Additional resources provided today: None  Evidence-based interventions for health behavior change  were utilized today including the discussion of self monitoring techniques, problem-solving barriers and SMART goal setting techniques.   Regarding patient's less desirable eating habits and patterns, we employed the technique of small changes.   Goal(s) for next OV: n/a    Recommended Physical Activity Goals Alice Humphrey has been advised to work up to 300-450 minutes of moderate intensity aerobic activity a week and strengthening exercises 2-3 times per week for cardiovascular health, weight loss maintenance and preservation of muscle mass.   She was encouraged to Continue to gradually increase the amount and intensity of exercise routine   Pharmacotherapy See Pre-DM note   ASSOCIATED CONDITIONS ADDRESSED TODAY:   Prediabetes Assessment & Plan: Lab Results  Component Value Date   HGBA1C 5.4 04/12/2024   HGBA1C 5.1 04/01/2022   HGBA1C 5.0 07/01/2018   INSULIN  7.3 04/12/2024    She is prescribed Metformin  500 mg twice daily and reports taking it 30% of the time. When she does take it, she often misses the second tablet. Currently does not have good control of hunger and cravings. Shared decision making: *** SWITCH and INCREASE to Metformin  XR 500 mg 2 tablets with lunch. Continue working on nutrition plan to decrease simple carbohydrates, increase lean proteins and exercise to promote weight loss and prevent progression to T2DM. Recheck labs.    Vitamin D  deficiency Assessment & Plan: Lab  Results  Component Value Date   VD25OH 20.7 (L) 04/12/2024    Currently on Ergocalciferol   50,000 units once a week with good compliance and tolerance. Vit D level from 04/12/2024 is not at goal of 50 to 70. Continue regimen (refill today) and weight loss efforts. Recheck levels today.    B12 deficiency Assessment & Plan: Lab Results  Component Value Date   VITAMINB12 425 04/12/2024   Taking OTC Vitamin B12 500 mcg daily with 30% compliance. Encouraged consistent daily adherence to  supplementation to improve levels (goal 500+). Continue nutrient rich diet. Recheck labs today.    Other hyperlipidemia Assessment & Plan: Lab Results  Component Value Date   CHOL 166 04/12/2024   HDL 51 04/12/2024   LDLCALC 104 (H) 04/12/2024   TRIG 56 04/12/2024   CHOLHDL 3.3 04/12/2024   HLD treated with diet and exercise. Reviewed lipid panel from 04/12/2024 with the pt. LDL was mildly elevated at 104; optimal level <100. Pt advised to continue to reduce saturated and trans fats in diet. Encouraged to increase exercise as able. Recheck lipid panel today.    Follow up:   Return 10/19/2024 2:00 PM.  She was informed of the importance of frequent follow up visits to maximize her success with intensive lifestyle modifications for her multiple health conditions.  Alice Humphrey is aware that we will review all of her lab results at our next visit together in person.  She is aware that if anything is critical/ life threatening with the results, we will be contacting her via MyChart or by my CMA will be calling them prior to the office visit to discuss acute management.     Subjective:    Chief complaint: Obesity Alice Humphrey is here to discuss her progress with her obesity treatment plan. She is on the Category 1 Plan and states she is following her eating plan approximately 25% of the time. Pt is lifting weights at the gym 45 minutes 2 days per week   Interval History:  Alice Humphrey is here for a follow up office visit.   Pt has experienced a weight loss of 2 lbs since last OV on 08/02/2024.   Her dietary and life style habits include:  - Tracking Calories/Macros: no, she is not on a journaling plan.  - Eating More Whole Foods: pt trying, but she acknowledges eating out a bit more often since LOV.  - Adequate Protein Intake: no  - Adequate Water Intake: yes  - Skipping Meals: no  - Sleeping 7-9 Hours/ Night: yes   Pharmacotherapy that aid with weight loss: She is  prescribed Metformin  500 mg twice daily and reports taking it 30% of the time. When she does take it, she often misses the second tablet.   Review of Systems:  Pertinent positives were addressed with patient today.  Reviewed by clinician on day of visit: allergies, medications, problem list, medical history, surgical history, family history, social history, and previous encounter notes.  Weight Summary and Biometrics   Weight Lost Since Last Visit: 2lb  Weight Gained Since Last Visit: 0lb   Vitals Temp: 98.7 F (37.1 C) BP: 102/69 Pulse Rate: 83 SpO2: 98 %   Anthropometric Measurements Height: 4' 11 (1.499 m) Weight: 177 lb (80.3 kg) BMI (Calculated): 35.73 Weight at Last Visit: 179lb Weight Lost Since Last Visit: 2lb Weight Gained Since Last Visit: 0lb Starting Weight: 196lb Total Weight Loss (lbs): 19 lb (8.618 kg) Peak Weight: 205lb   Body Composition  Body  Fat %: 39.9 % Fat Mass (lbs): 70.8 lbs Muscle Mass (lbs): 101.2 lbs Total Body Water (lbs): 75.6 lbs Visceral Fat Rating : 8   Other Clinical Data Fasting: yes Labs: yes Today's Visit #: 7 Starting Date: 04/12/24    Objective:   PHYSICAL EXAM: Blood pressure 102/69, pulse 83, temperature 98.7 F (37.1 C), height 4' 11 (1.499 m), weight 177 lb (80.3 kg), last menstrual period 08/25/2024, SpO2 98%, unknown if currently breastfeeding. Body mass index is 35.75 kg/m.  General: she is overweight, cooperative and in no acute distress. PSYCH: Has normal mood, affect and thought process.   HEENT: EOMI, sclerae are anicteric. Lungs: Normal breathing effort, no conversational dyspnea. Extremities: Moves * 4 Neurologic: A and O * 3, good insight  DIAGNOSTIC DATA REVIEWED: BMET    Component Value Date/Time   NA 142 04/12/2024 1032   K 4.9 04/12/2024 1032   CL 104 04/12/2024 1032   CO2 22 04/12/2024 1032   GLUCOSE 83 04/12/2024 1032   GLUCOSE 96 08/15/2015 2321   BUN 7 04/12/2024 1032   CREATININE  0.65 04/12/2024 1032   CALCIUM 9.2 04/12/2024 1032   GFRNONAA >60 08/15/2015 2321   GFRAA >60 08/15/2015 2321   Lab Results  Component Value Date   HGBA1C 5.4 04/12/2024   HGBA1C 5.0 07/01/2018   Lab Results  Component Value Date   INSULIN  7.3 04/12/2024   Lab Results  Component Value Date   TSH 0.761 04/12/2024   CBC    Component Value Date/Time   WBC 7.2 04/12/2024 1032   WBC 14.9 (H) 01/19/2019 0314   RBC 4.45 04/12/2024 1032   RBC 3.99 01/19/2019 0314   HGB 13.1 04/12/2024 1032   HCT 40.8 04/12/2024 1032   PLT 332 04/12/2024 1032   MCV 92 04/12/2024 1032   MCH 29.4 04/12/2024 1032   MCH 29.6 01/19/2019 0314   MCHC 32.1 04/12/2024 1032   MCHC 33.2 01/19/2019 0314   RDW 12.7 04/12/2024 1032   Iron Studies No results found for: IRON, TIBC, FERRITIN, IRONPCTSAT Lipid Panel     Component Value Date/Time   CHOL 166 04/12/2024 1032   TRIG 56 04/12/2024 1032   HDL 51 04/12/2024 1032   CHOLHDL 3.3 04/12/2024 1032   LDLCALC 104 (H) 04/12/2024 1032   Hepatic Function Panel     Component Value Date/Time   PROT 7.1 04/12/2024 1032   ALBUMIN 4.1 04/12/2024 1032   AST 15 04/12/2024 1032   ALT 14 04/12/2024 1032   ALKPHOS 115 04/12/2024 1032   BILITOT 0.4 04/12/2024 1032      Component Value Date/Time   TSH 0.761 04/12/2024 1032   Nutritional Lab Results  Component Value Date   VD25OH 20.7 (L) 04/12/2024    Attestations:   I, Special Puri, acting as a Stage manager for [employer], DO., have compiled all relevant documentation for today's office visit on behalf of Alice Jenkins, DO, while in the presence of [employer], DO.  I have reviewed the above documentation for accuracy and completeness, and I agree with the above. Alice JINNY Humphrey, D.O.  The 21st Century Cures Act was signed into law in 2016 which includes the topic of electronic health records.  This provides immediate access to information in MyChart. This includes consultation  notes, operative notes, office notes, lab results and pathology reports.  If you have any questions about what you read please let us  know at your next visit so we can discuss your concerns and take  corrective action if need be.  We are right here with you.

## 2024-09-14 NOTE — Patient Instructions (Signed)
 08/02/24 16:00 09/14/24 08:00   Body Fat % 41.8 % 39.9 %  Muscle Mass (lbs) 99.2 lbs 101.2 lbs  Fat Mass (lbs) 75.2 lbs 70.8 lbs  Total Body Water (lbs) 77 lbs 75.6 lbs  Visceral Fat Rating  9 8  Peak Weight 205lb 205lb  Starting Date 04/12/2024 04/12/2024  Starting Weight 196lb 196lb

## 2024-09-15 LAB — COMPREHENSIVE METABOLIC PANEL WITH GFR
ALT: 18 IU/L (ref 0–32)
AST: 16 IU/L (ref 0–40)
Albumin: 4.3 g/dL (ref 4.0–5.0)
Alkaline Phosphatase: 88 IU/L (ref 41–116)
BUN/Creatinine Ratio: 12 (ref 9–23)
BUN: 8 mg/dL (ref 6–20)
Bilirubin Total: 0.4 mg/dL (ref 0.0–1.2)
CO2: 20 mmol/L (ref 20–29)
Calcium: 9.2 mg/dL (ref 8.7–10.2)
Chloride: 104 mmol/L (ref 96–106)
Creatinine, Ser: 0.68 mg/dL (ref 0.57–1.00)
Globulin, Total: 2.7 g/dL (ref 1.5–4.5)
Glucose: 87 mg/dL (ref 70–99)
Potassium: 4.4 mmol/L (ref 3.5–5.2)
Sodium: 140 mmol/L (ref 134–144)
Total Protein: 7 g/dL (ref 6.0–8.5)
eGFR: 121 mL/min/1.73 (ref >59)

## 2024-09-15 LAB — INSULIN, RANDOM: INSULIN: 13.7 u[IU]/mL (ref 2.6–24.9)

## 2024-09-15 LAB — LIPID PANEL
Chol/HDL Ratio: 2.8 ratio (ref 0.0–4.4)
Cholesterol, Total: 158 mg/dL (ref 100–199)
HDL: 57 mg/dL (ref >39)
LDL Chol Calc (NIH): 88 mg/dL (ref 0–99)
Triglycerides: 64 mg/dL (ref 0–149)
VLDL Cholesterol Cal: 13 mg/dL (ref 5–40)

## 2024-09-15 LAB — HEMOGLOBIN A1C
Est. average glucose Bld gHb Est-mCnc: 100 mg/dL
Hgb A1c MFr Bld: 5.1 % (ref 4.8–5.6)

## 2024-09-15 LAB — VITAMIN B12: Vitamin B-12: 471 pg/mL (ref 232–1245)

## 2024-09-15 LAB — VITAMIN D 25 HYDROXY (VIT D DEFICIENCY, FRACTURES): Vit D, 25-Hydroxy: 50.6 ng/mL (ref 30.0–100.0)

## 2024-10-19 ENCOUNTER — Ambulatory Visit (INDEPENDENT_AMBULATORY_CARE_PROVIDER_SITE_OTHER): Admitting: Family Medicine

## 2024-10-19 ENCOUNTER — Encounter (INDEPENDENT_AMBULATORY_CARE_PROVIDER_SITE_OTHER): Payer: Self-pay | Admitting: Family Medicine

## 2024-10-19 VITALS — BP 95/66 | HR 95 | Temp 98.7°F | Ht 59.0 in | Wt 170.0 lb

## 2024-10-19 DIAGNOSIS — E538 Deficiency of other specified B group vitamins: Secondary | ICD-10-CM

## 2024-10-19 DIAGNOSIS — E559 Vitamin D deficiency, unspecified: Secondary | ICD-10-CM | POA: Diagnosis not present

## 2024-10-19 DIAGNOSIS — Z6836 Body mass index (BMI) 36.0-36.9, adult: Secondary | ICD-10-CM

## 2024-10-19 DIAGNOSIS — Z6834 Body mass index (BMI) 34.0-34.9, adult: Secondary | ICD-10-CM

## 2024-10-19 DIAGNOSIS — R7303 Prediabetes: Secondary | ICD-10-CM

## 2024-10-19 DIAGNOSIS — E7849 Other hyperlipidemia: Secondary | ICD-10-CM | POA: Diagnosis not present

## 2024-10-19 MED ORDER — METFORMIN HCL ER 500 MG PO TB24: 1000.0000 mg | ORAL_TABLET | Freq: Every day | ORAL | 0 refills | Status: DC

## 2024-10-19 MED ORDER — CYANOCOBALAMIN 500 MCG PO TABS: 500.0000 ug | ORAL_TABLET | Freq: Every day | ORAL | Status: DC

## 2024-10-19 MED ORDER — VITAMIN D (ERGOCALCIFEROL) 1.25 MG (50000 UNIT) PO CAPS: 50000.0000 [IU] | ORAL_CAPSULE | ORAL | 0 refills | Status: DC

## 2024-10-19 NOTE — Progress Notes (Signed)
 Alice Humphrey, D.O.  ABFM, ABOM Specializing in Clinical Bariatric Medicine  Office located at: [address] [place], [place]  [address]    FOR THE CHRONIC DISEASE OF OBESITY:   Morbid obesity (HCC) start BMI 39.59; BMI 36.0-36.9,adult current 34.32  Weight Summary and Body Composition Analysis  Weight Lost Since Last Visit: 7lb  Weight Gained Since Last Visit: 0lb    Vitals Temp: 98.7 F (37.1 C) BP: 95/66 Pulse Rate: 95 SpO2: 97 %   Anthropometric Measurements Height: 4' 11 (1.499 m) Weight: 170 lb (77.1 kg) BMI (Calculated): 34.32 Weight at Last Visit: 179lb Weight Lost Since Last Visit: 7lb Weight Gained Since Last Visit: 0lb Starting Weight: 196lb Total Weight Loss (lbs): 26 lb (11.8 kg) Peak Weight: 205lb   Body Composition  Body Fat %: 42.5 % Fat Mass (lbs): 72.6 lbs Muscle Mass (lbs): 93 lbs Total Body Water (lbs): 75 lbs Visceral Fat Rating : 8   Other Clinical Data Fasting: no Labs: no Today's Visit #: 8 Starting Date: 04/12/24    Chief complaint: Obesity  Interval History Alice Humphrey is here for a follow-up office visit to discuss her progress with her obesity treatment plan. She is on the Category 1 Plan and states she is following her eating plan approximately 50 % of the time. She is working out in the gym 45-60  minutes 2 days per week  She has experienced a weight loss of 7 lbs since last OV on 09/14/2024.   Her dietary and life habits include:  - Tracking Calories/Macros: no - she is not on a tracking plan  - Eating More Whole Foods: yes  - Adequate Protein Intake: no  - Adequate Water Intake: yes  - She indulges in pizza and ice-cream at times  - Skipping Meals: no  - Sleeping 7-9 Hours/ Night: yes    09/14/24 08:00 10/19/24 13:00   Body Fat % 39.9 % 42.5 %  Muscle Mass (lbs) 101.2 lbs 93 lbs  Fat Mass (lbs) 70.8 lbs 72.6 lbs  Total Body Water (lbs) 75.6 lbs 75 lbs  Visceral Fat Rating  8 8    Counseling done on how various foods will affect these numbers and how to maximize success  Total lbs lost to date: - 26 lbs Total Fat Mass lost to date: - 20.6 lbs Total weight loss percentage to date: - 13.27 %   Nutritional and Behavioral Counseling:  We discussed the following today: continue to work on maintaining a reduced calorie state, getting the recommended amount of protein, incorporating whole foods, making healthy choices, staying well hydrated and practicing mindfulness when eating.  Additional resources provided today: Handout on lean sources of protein,  Handout on holiday eating strategies  Evidence-based interventions for health behavior change were utilized today including the discussion of self monitoring techniques, problem-solving barriers and SMART goal setting techniques.   Regarding patient's less desirable eating habits and patterns, we employed the technique of small changes.   SMART Goal(s) created today: n/a   Recommended Dietary Goals Alice Humphrey is currently in the action stage of change. As such, her goal is to continue weight management plan.  She has agreed to continue the CAT 1 MP.   Recommended Physical Activity Goals Alice Humphrey has been advised to work up to 300-450 minutes of moderate intensity aerobic activity a week and strengthening exercises 2-3 times per week for cardiovascular health, weight loss maintenance and preservation of muscle mass.   She may Continue to  gradually increase the amount and intensity of exercise routine   Medical Interventions and Pharmacotherapy Previous Bariatric surgery: n/a Pharmacotherapy: Cont Metformin  at same dose.   OBESITY RELATED CONDITIONS ADDRESSED TODAY:    Medications Discontinued During This Encounter  Medication Reason   cyanocobalamin  (VITAMIN B12) 500 MCG tablet Reorder   metFORMIN  (GLUCOPHAGE -XR) 500 MG 24 hr tablet Reorder   Vitamin D , Ergocalciferol , (DRISDOL ) 1.25 MG (50000 UNIT) CAPS  capsule Reorder     Meds ordered this encounter  Medications   metFORMIN  (GLUCOPHAGE -XR) 500 MG 24 hr tablet    Sig: Take 2 tablets (1,000 mg total) by mouth daily with lunch.    Dispense:  60 tablet    Refill:  0   Vitamin D , Ergocalciferol , (DRISDOL ) 1.25 MG (50000 UNIT) CAPS capsule    Sig: Take 1 capsule (50,000 Units total) by mouth every 7 (seven) days.    Dispense:  4 capsule    Refill:  0   cyanocobalamin  (VITAMIN B12) 500 MCG tablet    Sig: Take 1 tablet (500 mcg total) by mouth daily.     Prediabetes Assessment & Plan: Lab Results  Component Value Date   HGBA1C 5.1 09/14/2024   HGBA1C 5.4 04/12/2024   HGBA1C 5.1 04/01/2022   INSULIN  13.7 09/14/2024   INSULIN  7.3 04/12/2024   On Metformin  XR 1,000 mg daily with reported good compliance and tolerance. Overall, pt feels the Metformin  is controlling her hunger and is helping curb her tendency to snack. Reviewed labs with pt. Pre-DM improving; A1c at goal. Continue Metformin  therapy and working on nutrition plan to decrease simple carbohydrates, increase lean proteins and exercise to promote weight loss and improve glycemic control and prevent progression to T2DM.    Other hyperlipidemia Assessment & Plan: Lab Results  Component Value Date   CHOL 158 09/14/2024   HDL 57 09/14/2024   LDLCALC 88 09/14/2024   TRIG 64 09/14/2024   CHOLHDL 2.8 09/14/2024   HLD managed with dietary and lifestyle interventions. Reviewed lipid panel with pt; overall cholesterol is improving. LDL improved 104 -> 88.  Cont to work on engineer, technical sales -decreasing simple carbohydrates, increasing lean proteins, decreasing saturated fats and cholesterol , avoiding trans fats and exercise as able to promote weight loss.    Vitamin D  deficiency Assessment & Plan: Lab Results  Component Value Date   VD25OH 50.6 09/14/2024   VD25OH 20.7 (L) 04/12/2024   Pt is doing well on Ergocalciferol   50,000 units once a week. Reviewed labs with pt. Vit D  levels are at goal of 50 to 70. Continue supplementation (refill today). Recheck in another 3-4 months.    B12 deficiency Assessment & Plan: Lab Results  Component Value Date   VITAMINB12 471 09/14/2024   She has been advised to take OTC B12 500 mcg daily but admits to only taking it 3-4 times in the past 2 months. Reviewed labs with pt. B12 level is 471, not quite at goal of over 500. Continue nutrient rich diet. Take B12 supplement more frequently. Recheck in another 3-4 months.    Objective:   PHYSICAL EXAM: Blood pressure 95/66, pulse 95, temperature 98.7 F (37.1 C), height 4' 11 (1.499 m), weight 170 lb (77.1 kg), last menstrual period 10/18/2024, SpO2 97%, unknown if currently breastfeeding. Body mass index is 34.34 kg/m.  General: she is overweight, cooperative and in no acute distress. PSYCH: Has normal mood, affect and thought process.   HEENT: EOMI, sclerae are anicteric. Lungs: Normal breathing effort, no conversational  dyspnea. Extremities: Moves * 4 Neurologic: A and O * 3, good insight  DIAGNOSTIC DATA REVIEWED: BMET    Component Value Date/Time   NA 140 09/14/2024 0916   K 4.4 09/14/2024 0916   CL 104 09/14/2024 0916   CO2 20 09/14/2024 0916   GLUCOSE 87 09/14/2024 0916   GLUCOSE 96 08/15/2015 2321   BUN 8 09/14/2024 0916   CREATININE 0.68 09/14/2024 0916   CALCIUM 9.2 09/14/2024 0916   GFRNONAA >60 08/15/2015 2321   GFRAA >60 08/15/2015 2321   Lab Results  Component Value Date   HGBA1C 5.1 09/14/2024   HGBA1C 5.0 07/01/2018   Lab Results  Component Value Date   INSULIN  13.7 09/14/2024   INSULIN  7.3 04/12/2024   Lab Results  Component Value Date   TSH 0.761 04/12/2024   CBC    Component Value Date/Time   WBC 7.2 04/12/2024 1032   WBC 14.9 (H) 01/19/2019 0314   RBC 4.45 04/12/2024 1032   RBC 3.99 01/19/2019 0314   HGB 13.1 04/12/2024 1032   HCT 40.8 04/12/2024 1032   PLT 332 04/12/2024 1032   MCV 92 04/12/2024 1032   MCH 29.4  04/12/2024 1032   MCH 29.6 01/19/2019 0314   MCHC 32.1 04/12/2024 1032   MCHC 33.2 01/19/2019 0314   RDW 12.7 04/12/2024 1032   Iron Studies No results found for: IRON, TIBC, FERRITIN, IRONPCTSAT Lipid Panel     Component Value Date/Time   CHOL 158 09/14/2024 0916   TRIG 64 09/14/2024 0916   HDL 57 09/14/2024 0916   CHOLHDL 2.8 09/14/2024 0916   LDLCALC 88 09/14/2024 0916   Hepatic Function Panel     Component Value Date/Time   PROT 7.0 09/14/2024 0916   ALBUMIN 4.3 09/14/2024 0916   AST 16 09/14/2024 0916   ALT 18 09/14/2024 0916   ALKPHOS 88 09/14/2024 0916   BILITOT 0.4 09/14/2024 0916      Component Value Date/Time   TSH 0.761 04/12/2024 1032   Nutritional Lab Results  Component Value Date   VD25OH 50.6 09/14/2024   VD25OH 20.7 (L) 04/12/2024     Follow up:   Return for 1 mo f/up.  She was informed of the importance of frequent follow up visits to maximize her success with intensive lifestyle modifications for her multiple health conditions.   Attestations:   I, Special Puri, acting as a stage manager for [employer], DO., have compiled all relevant documentation for today's office visit on behalf of Alice Jenkins, DO, while in the presence of [employer], DO.  Pertinent positives were addressed with patient today. Reviewed by clinician on day of visit: allergies, medications, problem list, medical history, surgical history, family history, social history, and previous encounter notes.  I have reviewed the above documentation for accuracy and completeness, and I agree with the above. Alice Humphrey, D.O.  The 21st Century Cures Act was signed into law in 2016 which includes the topic of electronic health records.  This provides immediate access to information in MyChart. This includes consultation notes, operative notes, office notes, lab results and pathology reports.  If you have any questions about what you read please let us  know at  your next visit so we can discuss your concerns and take corrective action if need be.  We are right here with you.

## 2024-11-23 ENCOUNTER — Encounter (INDEPENDENT_AMBULATORY_CARE_PROVIDER_SITE_OTHER): Payer: Self-pay | Admitting: Family Medicine

## 2024-11-23 ENCOUNTER — Ambulatory Visit (INDEPENDENT_AMBULATORY_CARE_PROVIDER_SITE_OTHER): Admitting: Family Medicine

## 2024-11-23 VITALS — BP 93/59 | HR 82 | Temp 98.4°F | Ht 59.0 in | Wt 167.0 lb

## 2024-11-23 DIAGNOSIS — R7303 Prediabetes: Secondary | ICD-10-CM

## 2024-11-23 DIAGNOSIS — E538 Deficiency of other specified B group vitamins: Secondary | ICD-10-CM

## 2024-11-23 DIAGNOSIS — Z6833 Body mass index (BMI) 33.0-33.9, adult: Secondary | ICD-10-CM

## 2024-11-23 DIAGNOSIS — E559 Vitamin D deficiency, unspecified: Secondary | ICD-10-CM

## 2024-11-23 MED ORDER — VITAMIN D (ERGOCALCIFEROL) 1.25 MG (50000 UNIT) PO CAPS: 50000.0000 [IU] | ORAL_CAPSULE | ORAL | 0 refills | Status: DC

## 2024-11-23 MED ORDER — CYANOCOBALAMIN 500 MCG PO TABS: 500.0000 ug | ORAL_TABLET | Freq: Every day | ORAL | Status: AC

## 2024-11-23 MED ORDER — METFORMIN HCL ER 500 MG PO TB24: 1000.0000 mg | ORAL_TABLET | Freq: Every day | ORAL | 0 refills | Status: DC

## 2024-11-23 NOTE — Progress Notes (Signed)
 Alice Humphrey, D.O.  ABFM, ABOM Specializing in Clinical Bariatric Medicine  Office located at: [address] [place], [place]  [address]    FOR THE CHRONIC DISEASE OF OBESITY:   Morbid obesity (HCC) start BMI 39.59 BMI 33.0-33.9,adult - current BMI 33.71  Weight Summary and Body Composition Analysis  Weight Lost Since Last Visit: 3 lb  Weight Gained Since Last Visit: 0    Vitals Temp: 98.4 F (36.9 C) BP: (!) 93/59 Pulse Rate: 82 SpO2: 98 %   Anthropometric Measurements Height: 4' 11 (1.499 m) Weight: 167 lb (75.8 kg) BMI (Calculated): 33.71 Weight at Last Visit: 170 lb Weight Lost Since Last Visit: 3 lb Weight Gained Since Last Visit: 0 Starting Weight: 196 lb Total Weight Loss (lbs): 29 lb (13.2 kg) Peak Weight: 205 lb   Body Composition  Body Fat %: 408 % Fat Mass (lbs): 68.2 lbs Muscle Mass (lbs): 94 lbs Total Body Water (lbs): 73.8 lbs Visceral Fat Rating : 8   Other Clinical Data Fasting: No Labs: No Today's Visit #: 9 Starting Date: 04/12/24    Chief complaint: Obesity  Interval History Alice Humphrey is here for a follow-up office visit to discuss Alice Humphrey progress with Alice Humphrey obesity treatment plan. Alice Humphrey is on the Category 1 Plan and states Alice Humphrey is following Alice Humphrey eating plan approximately 25 % of the time. Alice Humphrey is not exercising.  Alice Humphrey has experienced a weight loss of 3 lbs since last OV on 10/19/2024.   Alice Humphrey dietary and life habits include:  - Tracking Calories/Macros: not on a tracking plan  - Eating More Whole Foods: yes  - Adequate Protein Intake: no  - Adequate Water Intake: no  - Skipping Meals: no  - Sleeping 7-9 Hours/ Night: yes    10/19/24 13:00 11/23/24 15:00   Body Fat % 42.5 % 408 %  Muscle Mass (lbs) 93 lbs 94 lbs  Fat Mass (lbs) 72.6 lbs 68.2 lbs  Total Body Water (lbs) 75 lbs 73.8 lbs  Visceral Fat Rating  8 8   Counseling done on how various foods will affect these numbers and how to maximize  success  Total Fat mass lost to date: - 25 lbs Total lbs lost to date: - 29 lbs Total weight loss percentage to date: - 14.8 %   Nutritional and Behavioral Counseling:  We discussed the following today: increasing lean protein intake to established goals, high protein cereals, focusing on food with a 10:1 ratio of calories: grams of protein, increasing fiber rich foods, work on tracking and journaling calories using tracking application, and continue to work on implementation of reduced calorie nutritional plan  Additional resources provided today: Handout on holiday eating strategies, Handout on CAT 1-2 breakfast options, Handout on Daily Food Journaling Log, and Handout on December Goals  Evidence-based interventions for health behavior change were utilized today including the discussion of self monitoring techniques, problem-solving barriers and SMART goal setting techniques.   Regarding patient's less desirable eating habits and patterns, we employed the technique of small changes.   SMART Goal(s) created today: maintain wt through December   Recommended Dietary Goals Alice Humphrey is currently in the action stage of change. As such, Alice Humphrey goal is to continue weight management plan.  Alice Humphrey agrees to journal 1000 cal and 80+ grams protein ~ 4 days a week using the CAT 1 MP as a guide.    Recommended Physical Activity Goals Alice Humphrey has been advised to work up to 300-450 minutes of moderate  intensity aerobic activity a week and strengthening exercises 2-3 times per week for cardiovascular health, weight loss maintenance and preservation of muscle mass.   Alice Humphrey will Think about enjoyable ways to increase daily physical activity and overcoming barriers to exercise.   Medical Interventions and Pharmacotherapy Previous Bariatric surgery: n/a Pharmacotherapy: Cont Metformin  at same dose.   OBESITY RELATED CONDITIONS ADDRESSED TODAY:    Medications Discontinued During This Encounter  Medication  Reason   metFORMIN  (GLUCOPHAGE -XR) 500 MG 24 hr tablet Reorder   Vitamin D , Ergocalciferol , (DRISDOL ) 1.25 MG (50000 UNIT) CAPS capsule Reorder   cyanocobalamin  (VITAMIN B12) 500 MCG tablet Reorder     Meds ordered this encounter  Medications   metFORMIN  (GLUCOPHAGE -XR) 500 MG 24 hr tablet    Sig: Take 2 tablets (1,000 mg total) by mouth daily with lunch.    Dispense:  60 tablet    Refill:  0   Vitamin D , Ergocalciferol , (DRISDOL ) 1.25 MG (50000 UNIT) CAPS capsule    Sig: Take 1 capsule (50,000 Units total) by mouth every 7 (seven) days.    Dispense:  4 capsule    Refill:  0   cyanocobalamin  (VITAMIN B12) 500 MCG tablet    Sig: Take 1 tablet (500 mcg total) by mouth daily.     Prediabetes Assessment & Plan: Lab Results  Component Value Date   HGBA1C 5.1 09/14/2024   HGBA1C 5.4 04/12/2024   HGBA1C 5.1 04/01/2022   INSULIN  13.7 09/14/2024   INSULIN  7.3 04/12/2024    On Metformin  XR 1,000 mg daily with reported good compliance and tolerance. Good control of hunger and cravings. No acute concerns. Continue Metformin  therapy and working on nutrition plan to decrease simple carbohydrates, increase lean proteins and exercise to promote weight loss and improve glycemic control and prevent progression to T2DM.     Vitamin D  deficiency Assessment & Plan: Lab Results  Component Value Date   VD25OH 50.6 09/14/2024   VD25OH 20.7 (L) 04/12/2024   Alice Humphrey is doing well on Ergocalciferol   50,000 units wkly. Continue supplementation (refill today). Recheck in another 1-2 months.     B12 deficiency Assessment & Plan: Lab Results  Component Value Date   VITAMINB12 471 09/14/2024   Alice Humphrey has been advised to take OTC B12 500 mcg daily but admits to missing doses. Encouraged Alice Humphrey to take supplementation more frequently. Continue nutrient rich diet.Recheck in another 3-4 months.    Objective:   PHYSICAL EXAM: Blood pressure (!) 93/59, pulse 82, temperature 98.4 F (36.9 C), height 4' 11  (1.499 m), weight 167 lb (75.8 kg), SpO2 98%, unknown if currently breastfeeding. Body mass index is 33.73 kg/m.  General: Alice Humphrey is overweight, cooperative and in no acute distress. PSYCH: Has normal mood, affect and thought process.   HEENT: EOMI, sclerae are anicteric. Lungs: Normal breathing effort, no conversational dyspnea. Extremities: Moves * 4 Neurologic: A and O * 3, good insight  DIAGNOSTIC DATA REVIEWED: BMET    Component Value Date/Time   NA 140 09/14/2024 0916   K 4.4 09/14/2024 0916   CL 104 09/14/2024 0916   CO2 20 09/14/2024 0916   GLUCOSE 87 09/14/2024 0916   GLUCOSE 96 08/15/2015 2321   BUN 8 09/14/2024 0916   CREATININE 0.68 09/14/2024 0916   CALCIUM 9.2 09/14/2024 0916   GFRNONAA >60 08/15/2015 2321   GFRAA >60 08/15/2015 2321   Lab Results  Component Value Date   HGBA1C 5.1 09/14/2024   HGBA1C 5.0 07/01/2018   Lab Results  Component Value  Date   INSULIN  13.7 09/14/2024   INSULIN  7.3 04/12/2024   Lab Results  Component Value Date   TSH 0.761 04/12/2024   CBC    Component Value Date/Time   WBC 7.2 04/12/2024 1032   WBC 14.9 (H) 01/19/2019 0314   RBC 4.45 04/12/2024 1032   RBC 3.99 01/19/2019 0314   HGB 13.1 04/12/2024 1032   HCT 40.8 04/12/2024 1032   PLT 332 04/12/2024 1032   MCV 92 04/12/2024 1032   MCH 29.4 04/12/2024 1032   MCH 29.6 01/19/2019 0314   MCHC 32.1 04/12/2024 1032   MCHC 33.2 01/19/2019 0314   RDW 12.7 04/12/2024 1032   Iron Studies No results found for: IRON, TIBC, FERRITIN, IRONPCTSAT Lipid Panel     Component Value Date/Time   CHOL 158 09/14/2024 0916   TRIG 64 09/14/2024 0916   HDL 57 09/14/2024 0916   CHOLHDL 2.8 09/14/2024 0916   LDLCALC 88 09/14/2024 0916   Hepatic Function Panel     Component Value Date/Time   PROT 7.0 09/14/2024 0916   ALBUMIN 4.3 09/14/2024 0916   AST 16 09/14/2024 0916   ALT 18 09/14/2024 0916   ALKPHOS 88 09/14/2024 0916   BILITOT 0.4 09/14/2024 0916      Component  Value Date/Time   TSH 0.761 04/12/2024 1032   Nutritional Lab Results  Component Value Date   VD25OH 50.6 09/14/2024   VD25OH 20.7 (L) 04/12/2024     Follow up:   Return 12/28/2024 at 3:20 PM.  Alice Humphrey was informed of the importance of frequent follow up visits to maximize Alice Humphrey success with intensive lifestyle modifications for Alice Humphrey multiple health conditions.   Attestations:   I, Special Puri, acting as a stage manager for [employer], DO., have compiled all relevant documentation for today's office visit on behalf of Alice Jenkins, DO, while in the presence of [employer], DO.  Pertinent positives were addressed with patient today. Reviewed by clinician on day of visit: allergies, medications, problem list, medical history, surgical history, family history, social history, and previous encounter notes.  I have reviewed the above documentation for accuracy and completeness, and I agree with the above. Alice JINNY Humphrey, D.O.  The 21st Century Cures Act was signed into law in 2016 which includes the topic of electronic health records.  This provides immediate access to information in MyChart. This includes consultation notes, operative notes, office notes, lab results and pathology reports.  If you have any questions about what you read please let us  know at your next visit so we can discuss your concerns and take corrective action if need be.  We are right here with you.

## 2024-12-28 ENCOUNTER — Encounter (INDEPENDENT_AMBULATORY_CARE_PROVIDER_SITE_OTHER): Payer: Self-pay | Admitting: Family Medicine

## 2024-12-28 ENCOUNTER — Ambulatory Visit (INDEPENDENT_AMBULATORY_CARE_PROVIDER_SITE_OTHER): Admitting: Family Medicine

## 2024-12-28 DIAGNOSIS — Z6832 Body mass index (BMI) 32.0-32.9, adult: Secondary | ICD-10-CM

## 2024-12-28 DIAGNOSIS — R7303 Prediabetes: Secondary | ICD-10-CM | POA: Diagnosis not present

## 2024-12-28 DIAGNOSIS — E538 Deficiency of other specified B group vitamins: Secondary | ICD-10-CM

## 2024-12-28 DIAGNOSIS — E559 Vitamin D deficiency, unspecified: Secondary | ICD-10-CM

## 2024-12-28 DIAGNOSIS — Z6833 Body mass index (BMI) 33.0-33.9, adult: Secondary | ICD-10-CM

## 2024-12-28 MED ORDER — VITAMIN D (ERGOCALCIFEROL) 1.25 MG (50000 UNIT) PO CAPS: 50000.0000 [IU] | ORAL_CAPSULE | ORAL | 0 refills | Status: DC

## 2024-12-28 MED ORDER — METFORMIN HCL ER 500 MG PO TB24: 1000.0000 mg | ORAL_TABLET | Freq: Every day | ORAL | 0 refills | Status: AC

## 2024-12-28 NOTE — Progress Notes (Signed)
 "  Alice Humphrey, D.O.  ABFM, ABOM Specializing in Clinical Bariatric Medicine  Office located at: [address] [place], [place]  [address]      A) FOR THE CHRONIC DISEASE OF OBESITY:  Morbid obesity (HCC) start BMI 39.59 BMI 33.0-33.9,adult - current BMI 32.5  Chief complaint: Obesity Alice Humphrey is here to discuss her progress with her obesity treatment plan.   History of present illness / Interval history:  Alice Humphrey is here today for her follow-up office visit.  Since last OV on 11/23/2024, pt is down 6 lbs.   Reports she was not indulging as much and being more mindful of her dietary habits.   11/23/24 15:00 12/28/24 15:00   Body Fat % 408 % 40.4 %  Muscle Mass (lbs) 94 lbs 91.4 lbs  Fat Mass (lbs) 68.2 lbs 65.2 lbs  Total Body Water (lbs) 73.8 lbs 74 lbs  Visceral Fat Rating  8 7  Counseling done on how various foods will affect these numbers and how to maximize success   Total lbs lost to date: -35 lbs Total Fat Mass in lbs lost to date: -28.3 Total weight loss percentage to date: -17.86 %   Nutrition Therapy She is on the Category 1 Plan and states she is following her eating plan approximately 50 % of the time.   - Tracking Calories/Macros: no  - Eating More Whole Foods: yes  - Adequate Protein Intake: no - but it has improved from LOV  - Adequate Water Intake: yes  - Skipping Meals: no  - Sleeping 7-9 Hours/ Night: no   Alice Humphrey is currently in the action stage of change. As such, her goal is to continue weight management plan.  She has agreed to: continue current plan   Physical Activity Pt is not exercising.   Alice Humphrey has been advised to work up to 300-450 minutes of moderate intensity aerobic activity a week and strengthening exercises 2-3 times per week for cardiovascular health, weight loss maintenance and preservation of muscle mass.  She has agreed to : Think about enjoyable ways to increase daily physical activity and overcoming  barriers to exercise and Increase physical activity in their day and reduce sedentary time (increase NEAT).   Behavioral Modifications Evidence-based interventions for health behavior change were utilized today including the discussion of  1) self monitoring techniques:  being more mindful of eating habits 2) self care:  exercise 3) SMART goals for next OV:  Start exercising Regarding patient's less desirable eating habits and patterns, we employed the technique of small changes.   We discussed the following today: increasing lean protein intake to established goals, avoiding skipping meals, increasing water intake , work on managing stress, creating time for self-care and relaxation, continue to work on implementation of reduced calorie nutritional plan, and continue to practice mindfulness when eating Additional resources provided today: None   Medical Interventions/ Pharmacotherapy Previous Bariatric surgery: none Pharmacotherapy for weight loss: She is currently taking Metformin  XR 1000 mg once daily for medical weight loss.    We discussed various medication options to help [name] with her weight loss efforts and we both agreed to : Continue with current nutritional and behavioral strategies   B) OBESITY RELATED CONDITIONS ADDRESSED TODAY:  Prediabetes Assessment & Plan Lab Results  Component Value Date   HGBA1C 5.1 09/14/2024   HGBA1C 5.4 04/12/2024   HGBA1C 5.1 04/01/2022   INSULIN  13.7 09/14/2024   INSULIN  7.3 04/12/2024  On Metformin  XR 1,000 mg  daily with good compliance and tolerance. Hunger and cravings are well controlled. No acute concerns. Cont medication(refill today). Cont decreasing simple carbs/sugars and increasing lean proteins. Increase exercise as able.    Vitamin D  deficiency Assessment & Plan Lab Results  Component Value Date   VD25OH 50.6 09/14/2024   VD25OH 20.7 (L) 04/12/2024  Currently on Ergo 50K units once weekly. Vit D levels are at goal.  Cont regimen(refill today). Recheck levels in 1-2 months.    B12 deficiency Assessment & Plan  - B12 level is 471, not at goal of over 500.  This diagnosis was reviewed with the patient and education was provided.  Lab Results  Component Value Date   VITAMINB12 471 09/14/2024  - Taking OTC b12 500 mcg daily - Continue their prudent nutritional plan and focus on b12 rich foods such as lean red meats; poultry; eggs; seafood; beans, peas, and lentils; nuts and seeds; and soy products - We will continue to monitor as deemed clinically necessary.   Medications Discontinued During This Encounter  Medication Reason   metFORMIN  (GLUCOPHAGE -XR) 500 MG 24 hr tablet Reorder   Vitamin D , Ergocalciferol , (DRISDOL ) 1.25 MG (50000 UNIT) CAPS capsule Reorder     Meds ordered this encounter  Medications   metFORMIN  (GLUCOPHAGE -XR) 500 MG 24 hr tablet    Sig: Take 2 tablets (1,000 mg total) by mouth daily with lunch.    Dispense:  60 tablet    Refill:  0   Vitamin D , Ergocalciferol , (DRISDOL ) 1.25 MG (50000 UNIT) CAPS capsule    Sig: Take 1 capsule (50,000 Units total) by mouth every 7 (seven) days.    Dispense:  4 capsule    Refill:  0      Follow up:   Return 01/25/2025 3:20 PM.  She was informed of the importance of frequent follow up visits to maximize her success with intensive lifestyle modifications for her multiple health conditions.   Weight Summary and Biometrics   Weight Lost Since Last Visit: 6lb  Weight Gained Since Last Visit: 0lb    Vitals Temp: 98 F (36.7 C) BP: 94/64 Pulse Rate: 76 SpO2: 98 %   Anthropometric Measurements Height: 4' 11 (1.499 m) Weight: 161 lb (73 kg) BMI (Calculated): 32.5 Weight at Last Visit: 167lb Weight Lost Since Last Visit: 6lb Weight Gained Since Last Visit: 0lb Starting Weight: 196lb Total Weight Loss (lbs): 35 lb (15.9 kg) Peak Weight: 205lb   Body Composition  Body Fat %: 40.4 % Fat Mass (lbs): 65.2 lbs Muscle Mass  (lbs): 91.4 lbs Total Body Water (lbs): 74 lbs Visceral Fat Rating : 7   Other Clinical Data Fasting: no Labs: no Today's Visit #: 10 Starting Date: 04/12/24    Objective:   PHYSICAL EXAM: Blood pressure 94/64, pulse 76, temperature 98 F (36.7 C), height 4' 11 (1.499 m), weight 161 lb (73 kg), last menstrual period 12/07/2024, SpO2 98%, unknown if currently breastfeeding. Body mass index is 32.52 kg/m.  General: she is overweight, cooperative and in no acute distress. PSYCH: Has normal mood, affect and thought process.   HEENT: EOMI, sclerae are anicteric. Lungs: Normal breathing effort, no conversational dyspnea. Extremities: Moves * 4 Neurologic: A and O * 3, good insight  DIAGNOSTIC DATA REVIEWED: BMET    Component Value Date/Time   NA 140 09/14/2024 0916   K 4.4 09/14/2024 0916   CL 104 09/14/2024 0916   CO2 20 09/14/2024 0916   GLUCOSE 87 09/14/2024 0916   GLUCOSE 96 08/15/2015 2321  BUN 8 09/14/2024 0916   CREATININE 0.68 09/14/2024 0916   CALCIUM 9.2 09/14/2024 0916   GFRNONAA >60 08/15/2015 2321   GFRAA >60 08/15/2015 2321   Lab Results  Component Value Date   HGBA1C 5.1 09/14/2024   HGBA1C 5.0 07/01/2018   Lab Results  Component Value Date   INSULIN  13.7 09/14/2024   INSULIN  7.3 04/12/2024   Lab Results  Component Value Date   TSH 0.761 04/12/2024   CBC    Component Value Date/Time   WBC 7.2 04/12/2024 1032   WBC 14.9 (H) 01/19/2019 0314   RBC 4.45 04/12/2024 1032   RBC 3.99 01/19/2019 0314   HGB 13.1 04/12/2024 1032   HCT 40.8 04/12/2024 1032   PLT 332 04/12/2024 1032   MCV 92 04/12/2024 1032   MCH 29.4 04/12/2024 1032   MCH 29.6 01/19/2019 0314   MCHC 32.1 04/12/2024 1032   MCHC 33.2 01/19/2019 0314   RDW 12.7 04/12/2024 1032   Iron Studies No results found for: IRON, TIBC, FERRITIN, IRONPCTSAT Lipid Panel     Component Value Date/Time   CHOL 158 09/14/2024 0916   TRIG 64 09/14/2024 0916   HDL 57 09/14/2024 0916    CHOLHDL 2.8 09/14/2024 0916   LDLCALC 88 09/14/2024 0916   Hepatic Function Panel     Component Value Date/Time   PROT 7.0 09/14/2024 0916   ALBUMIN 4.3 09/14/2024 0916   AST 16 09/14/2024 0916   ALT 18 09/14/2024 0916   ALKPHOS 88 09/14/2024 0916   BILITOT 0.4 09/14/2024 0916      Component Value Date/Time   TSH 0.761 04/12/2024 1032   Nutritional Lab Results  Component Value Date   VD25OH 50.6 09/14/2024   VD25OH 20.7 (L) 04/12/2024    Attestations:   LILLETTE Feliciano Mingle, acting as a stage manager for Alice Jenkins, DO., have compiled all relevant documentation for today's office visit on behalf of Alice Jenkins, DO, while in the presence of [employer], DO.   I have reviewed the above documentation for accuracy and completeness, and I agree with the above. Alice JINNY Humphrey, D.O.  The 21st Century Cures Act was signed into law in 2016 which includes the topic of electronic health records.  This provides immediate access to information in MyChart.  This includes consultation notes, operative notes, office notes, lab results and pathology reports.  If you have any questions about what you read please let us  know at your next visit so we can discuss your concerns and take corrective action if need be.  We are right here with you.  "

## 2025-01-05 ENCOUNTER — Ambulatory Visit (INDEPENDENT_AMBULATORY_CARE_PROVIDER_SITE_OTHER): Payer: Self-pay

## 2025-01-05 VITALS — BP 138/81 | HR 69 | Ht 59.0 in | Wt 166.4 lb

## 2025-01-05 DIAGNOSIS — Z3201 Encounter for pregnancy test, result positive: Secondary | ICD-10-CM

## 2025-01-05 LAB — POCT URINE PREGNANCY: Preg Test, Ur: POSITIVE — AB

## 2025-01-05 NOTE — Progress Notes (Signed)
 Alice Humphrey here for a UPT. Pt had a positive upt at home. LMP is 12/07/2024.     UPT in office Positive.    Reviewed medications and informed to start a PNV, if not already. Pt to follow up in 3 weeks for New OB visit.

## 2025-01-20 ENCOUNTER — Other Ambulatory Visit: Payer: Self-pay

## 2025-01-20 ENCOUNTER — Emergency Department (HOSPITAL_BASED_OUTPATIENT_CLINIC_OR_DEPARTMENT_OTHER): Payer: Self-pay

## 2025-01-20 ENCOUNTER — Emergency Department (HOSPITAL_BASED_OUTPATIENT_CLINIC_OR_DEPARTMENT_OTHER)
Admission: EM | Admit: 2025-01-20 | Discharge: 2025-01-20 | Disposition: A | Discharge status: Home / Self Care | Payer: Self-pay | Attending: Emergency Medicine | Admitting: Emergency Medicine

## 2025-01-20 ENCOUNTER — Encounter (HOSPITAL_BASED_OUTPATIENT_CLINIC_OR_DEPARTMENT_OTHER): Payer: Self-pay | Admitting: Emergency Medicine

## 2025-01-20 DIAGNOSIS — O200 Threatened abortion: Secondary | ICD-10-CM | POA: Insufficient documentation

## 2025-01-20 DIAGNOSIS — O208 Other hemorrhage in early pregnancy: Secondary | ICD-10-CM

## 2025-01-20 DIAGNOSIS — Z3A01 Less than 8 weeks gestation of pregnancy: Secondary | ICD-10-CM | POA: Insufficient documentation

## 2025-01-20 LAB — COMPREHENSIVE METABOLIC PANEL WITH GFR
ALT: 17 U/L (ref 0–44)
AST: 14 U/L — ABNORMAL LOW (ref 15–41)
Albumin: 4.3 g/dL (ref 3.5–5.0)
Alkaline Phosphatase: 75 U/L (ref 38–126)
Anion gap: 10 (ref 5–15)
BUN: 8 mg/dL (ref 6–20)
CO2: 25 mmol/L (ref 22–32)
Calcium: 9.2 mg/dL (ref 8.9–10.3)
Chloride: 102 mmol/L (ref 98–111)
Creatinine, Ser: 0.53 mg/dL (ref 0.44–1.00)
GFR, Estimated: >60 mL/min
Glucose, Bld: 99 mg/dL (ref 70–99)
Potassium: 4.2 mmol/L (ref 3.5–5.1)
Sodium: 136 mmol/L (ref 135–145)
Total Bilirubin: 0.4 mg/dL (ref 0.0–1.2)
Total Protein: 7 g/dL (ref 6.5–8.1)

## 2025-01-20 LAB — CBC WITH DIFFERENTIAL/PLATELET
Abs Immature Granulocytes: 0.05 10*3/uL (ref 0.00–0.07)
Basophils Absolute: 0 10*3/uL (ref 0.0–0.1)
Basophils Relative: 0 %
Eosinophils Absolute: 0.1 10*3/uL (ref 0.0–0.5)
Eosinophils Relative: 1 %
HCT: 39.2 % (ref 36.0–46.0)
Hemoglobin: 13.5 g/dL (ref 12.0–15.0)
Immature Granulocytes: 0 %
Lymphocytes Relative: 18 %
Lymphs Abs: 2.3 10*3/uL (ref 0.7–4.0)
MCH: 32 pg (ref 26.0–34.0)
MCHC: 34.4 g/dL (ref 30.0–36.0)
MCV: 92.9 fL (ref 80.0–100.0)
Monocytes Absolute: 0.9 10*3/uL (ref 0.1–1.0)
Monocytes Relative: 7 %
Neutro Abs: 9.1 10*3/uL — ABNORMAL HIGH (ref 1.7–7.7)
Neutrophils Relative %: 74 %
Platelets: 314 10*3/uL (ref 150–400)
RBC: 4.22 MIL/uL (ref 3.87–5.11)
RDW: 12.8 % (ref 11.5–15.5)
WBC: 12.4 10*3/uL — ABNORMAL HIGH (ref 4.0–10.5)
nRBC: 0 % (ref 0.0–0.2)

## 2025-01-20 LAB — URINALYSIS, ROUTINE W REFLEX MICROSCOPIC
Bilirubin Urine: NEGATIVE
Glucose, UA: NEGATIVE mg/dL
Ketones, ur: NEGATIVE mg/dL
Leukocytes,Ua: NEGATIVE
Nitrite: NEGATIVE
Protein, ur: NEGATIVE mg/dL
Specific Gravity, Urine: 1.01 (ref 1.005–1.030)
pH: 6 (ref 5.0–8.0)

## 2025-01-20 LAB — ABO/RH: ABO/RH(D): O POS

## 2025-01-20 LAB — URINALYSIS, MICROSCOPIC (REFLEX)

## 2025-01-20 LAB — HCG, QUANTITATIVE, PREGNANCY: hCG, Beta Chain, Quant, S: 52595 m[IU]/mL — ABNORMAL HIGH

## 2025-01-20 MED ORDER — PROTHROMBIN COMPLEX CONC HUMAN 500 UNITS IV KIT: 50.0000 [IU]/kg | PACK | Status: DC

## 2025-01-20 NOTE — ED Triage Notes (Signed)
 Pt reports vaginal bleeding for appx 2 hrs, less than 1 pad used per hour.  Reports she is currently appx [redacted] weeks pregnant. Denies abd pain.     G3P1

## 2025-01-20 NOTE — ED Provider Notes (Signed)
 " [facility] Provider Note   CSN: [id number] Arrival date & time: 01/20/25  1602     History {Add pertinent medical, surgical, social history, OB history to HPI:1} Chief Complaint  Patient presents with   Vaginal Bleeding    Alice Humphrey is a 29 y.o. female with PMH as listed below who presents with vaginal bleeding for appx 2 hrs, less than 1 pad used per hour.  Reports she is currently appx [redacted] weeks pregnant. Denies abd pain.      G3P1.    Past Medical History:  Diagnosis Date   Anxiety    Depression    Drug abuse (HCC)    Heart burn    Medical history non-contributory    Palpitations    Pre-diabetes 2006   Swallowing difficulty    Vitamin D  deficiency        Home Medications Prior to Admission medications  Medication Sig Start Date End Date Taking? Authorizing Provider  cetirizine  (ZYRTEC ) 10 MG tablet Take 10 mg by mouth daily. Patient not taking: Reported on 01/05/2025 08/03/21   [provider]  cyanocobalamin  (VITAMIN B12) 500 MCG tablet Take 1 tablet (500 mcg total) by mouth daily. Patient not taking: Reported on 01/05/2025 11/23/24   Midge Sober, DO  hydrOXYzine  (ATARAX /VISTARIL ) 25 MG tablet Take 25 mg by mouth as needed. Patient not taking: Reported on 01/05/2025 08/05/21   [provider]  metFORMIN  (GLUCOPHAGE -XR) 500 MG 24 hr tablet Take 2 tablets (1,000 mg total) by mouth daily with lunch. Patient not taking: Reported on 01/05/2025 12/28/24   Midge Sober, DO  Vitamin D , Ergocalciferol , (DRISDOL ) 1.25 MG (50000 UNIT) CAPS capsule Take 1 capsule (50,000 Units total) by mouth every 7 (seven) days. 12/28/24   Midge Sober, DO      Allergies    Patient has no known allergies.    Review of Systems   Review of Systems A 10 point review of systems was performed and is negative unless otherwise reported in HPI.  Physical Exam Updated Vital Signs BP 133/79 (BP Location: Right Arm)    Pulse 78   Temp 98.4 F (36.9 C)   Resp 18   LMP 12/07/2024 (Exact Date)   SpO2 100%  Physical Exam General: Normal appearing {Desc; female/female:11659}, lying in bed.  HEENT: PERRLA, Sclera anicteric, MMM, trachea midline.  Cardiology: RRR, no murmurs/rubs/gallops. BL radial and DP pulses equal bilaterally.  Resp: Normal respiratory rate and effort. CTAB, no wheezes, rhonchi, crackles.  Abd: Soft, non-tender, non-distended. No rebound tenderness or guarding.  GU: Deferred. MSK: No peripheral edema or signs of trauma. Extremities without deformity or TTP. No cyanosis or clubbing. Skin: warm, dry. No rashes or lesions. Back: No CVA tenderness Neuro: A&Ox4, CNs II-XII grossly intact. MAEs. Sensation grossly intact.  Psych: Normal mood and affect.   ED Results / Procedures / Treatments   Labs (all labs ordered are listed, but only abnormal results are displayed) Labs Reviewed  CBC WITH DIFFERENTIAL/PLATELET - Abnormal; Notable for the following components:      Result Value   WBC 12.4 (*)    Neutro Abs 9.1 (*)    All other components within normal limits  HCG, QUANTITATIVE, PREGNANCY - Abnormal; Notable for the following components:   hCG, Beta Chain, Quant, S 52,595 (*)    All other components within normal limits  URINALYSIS, ROUTINE W REFLEX MICROSCOPIC  ABO/RH    EKG None  Radiology No results found.  Procedures  Procedures  {Document cardiac monitor, telemetry assessment procedure when appropriate:1}  Medications Ordered in ED Medications - No data to display  ED Course/ Medical Decision Making/ A&P                          Medical Decision Making Amount and/or Complexity of Data Reviewed Labs: ordered. Decision-making details documented in ED Course.    This patient presents to the ED for concern of ***, this involves an extensive number of treatment options, and is a complaint that carries with it a high risk of complications and morbidity.  I considered the  following differential and admission for this acute, potentially life threatening condition.   MDM:    ***  Clinical Course as of 01/20/25 1747  Fri Jan 20, 2025  1747 HCG, Beta Chain, Alice Humphrey(!): 47,404 [HN]    Clinical Course User Index [HN] Franklyn Sid SAILOR, MD    Labs: I Ordered, and personally interpreted labs.  The pertinent results include:  ***  Imaging Studies ordered: I ordered imaging studies including *** I independently visualized and interpreted imaging. I agree with the radiologist interpretation  Additional history obtained from ***.  External records from outside source obtained and reviewed including ***  Cardiac Monitoring: The patient was maintained on a cardiac monitor.  I personally viewed and interpreted the cardiac monitored which showed an underlying rhythm of: ***  Reevaluation: After the interventions noted above, I reevaluated the patient and found that they have :{resolved/improved/worsened:23923::improved}  Social Determinants of Health: ***  Disposition:  ***  Co morbidities that complicate the patient evaluation  Past Medical History:  Diagnosis Date   Anxiety    Depression    Drug abuse (HCC)    Heart burn    Medical history non-contributory    Palpitations    Pre-diabetes 2006   Swallowing difficulty    Vitamin D  deficiency      Medicines No orders of the defined types were placed in this encounter.   I have reviewed the patients home medicines and have made adjustments as needed  Problem List / ED Course: Problem List Items Addressed This Visit   None        {Document critical care time when appropriate:1} {Document review of labs and clinical decision tools ie heart score, Chads2Vasc2 etc:1}  {Document your independent review of radiology images, and any outside records:1} {Document your discussion with family members, caretakers, and with consultants:1} {Document social determinants of health affecting pt's  care:1} {Document your decision making why or why not admission, treatments were needed:1}  This note was created using dictation software, which may contain spelling or grammatical errors.  "

## 2025-01-20 NOTE — Discharge Instructions (Addendum)
 Thank you for coming to [facility]. You were seen for bleeding during the first trimester.  This is technically called a threatened miscarriage.  You were found on your ultrasound to have a subchorionic hemorrhage or subchorionic hematoma.  Please follow-up with your obstetrician within 1 to 2 weeks as originally scheduled.  Do not hesitate to return to the ED or call 911 if you experience: -Worsening symptoms -Nausea vomiting cyst or you cannot eat, drink, or take your medications -Severe vaginal bleeding saturating greater than 2 pads per hour for greater than 2 hours -Severe abdominal pain -Passage of fetal tissue -Lightheadedness, passing out -Fevers/chills -Anything else that concerns you

## 2025-01-25 ENCOUNTER — Encounter (INDEPENDENT_AMBULATORY_CARE_PROVIDER_SITE_OTHER): Payer: Self-pay | Admitting: Family Medicine

## 2025-01-25 ENCOUNTER — Ambulatory Visit (INDEPENDENT_AMBULATORY_CARE_PROVIDER_SITE_OTHER): Admitting: Family Medicine

## 2025-01-25 DIAGNOSIS — E559 Vitamin D deficiency, unspecified: Secondary | ICD-10-CM

## 2025-01-25 DIAGNOSIS — R7303 Prediabetes: Secondary | ICD-10-CM | POA: Insufficient documentation

## 2025-01-25 DIAGNOSIS — Z3490 Encounter for supervision of normal pregnancy, unspecified, unspecified trimester: Secondary | ICD-10-CM

## 2025-01-25 DIAGNOSIS — Z6833 Body mass index (BMI) 33.0-33.9, adult: Secondary | ICD-10-CM

## 2025-01-25 MED ORDER — VITAMIN D (ERGOCALCIFEROL) 1.25 MG (50000 UNIT) PO CAPS: 50000.0000 [IU] | ORAL_CAPSULE | ORAL | 0 refills | Status: AC

## 2025-01-25 NOTE — Progress Notes (Incomplete)
 "  Alice DOROTHA Humphrey, D.O.  ABFM, ABOM Specializing in Clinical Bariatric Medicine  Office located at: [address] [place], [place]  [address]        No orders of the defined types were placed in this encounter.   Medications Discontinued During This Encounter  Medication Reason   Vitamin D , Ergocalciferol , (DRISDOL ) 1.25 MG (50000 UNIT) CAPS capsule Reorder     Meds ordered this encounter  Medications   Vitamin D , Ergocalciferol , (DRISDOL ) 1.25 MG (50000 UNIT) CAPS capsule    Sig: Take 1 capsule (50,000 Units total) by mouth every 7 (seven) days.    Dispense:  12 capsule    Refill:  0      FOR THE CHRONIC DISEASE OF OBESITY: Morbid obesity (HCC) start BMI 39.59 BMI 33.0-33.9,adult - current BMI: 32.1  Chief complaint: Obesity Alice Humphrey is here to discuss her progress with her obesity treatment plan.   History of present illness / Interval history:  Alice Humphrey is here today for her follow-up office visit.  Since last OV on 12/28/2024 with provider: Dr. Jenkins, patient is down 2 lbs.  Recent challenges/ barriers to successful adherence of program recommendations:  Due to recent weather conditions she was off-plan -->suboptimal adherence  Pt reports she has   Pt has been struggling with:  [x]  meal planning and prepping []  exercise []  sleep []  stressors []  mood []  chronic or acute medical conditions-  []  eating out more []  Nothing, doing great  Pt has been working on and improving their:  []  meal planning and prepping []  exercise []  sleep hygiene []  water intake []  strategies to better manage personal stressors []  strategies to better manage mood []  eating out less [x]  Nothing particular at this time  When asked by CMA prior to our office visit today, pt states they have been:  - Focused on eating fresh, unprocessed foods?  Yes   - Focused on eating lean proteins with each meal?   No - Sleeping 7-9 Hours/ Night?  Yes   - Skipping Meals?  No   -  States she is following her healthy eating plan approximately 25% of the time.   Recent weight loss data history  12/28/24 15:00 01/25/25 15:00   Body Fat % 40.4 % 42 %  Muscle Mass (lbs) 91.4 lbs 87.8 lbs  Fat Mass (lbs) 65.2 lbs 67 lbs  Total Body Water (lbs) 74 lbs 73 lbs  Visceral Fat Rating  7 8    Physician directed Nutrition Therapy prescription: She is on the Category 1 Plan.    Alice Humphrey is currently in the action stage of change. As such, her goal is to continue weight management plan.  She has agreed to: continue current reduced-calorie meal plan    Physician directed Behavioral Modification prescription: Evidence-based interventions for healthy behavior change were utilized today including the discussion of small changes and SMART goals. barriers to successful adherence to behavorial change for wt loss: recent weather conditions.  We discussed the following today: increasing lean protein intake to established goals, decreasing simple carbohydrates , continue to work on implementation of reduced calorie nutritional plan, and continue to practice mindfulness when eating  Additional resources provided: None   Physician directed Physical Activity prescription: Pt is walking 30  minutes 3 days per week barriers to successful adherence to exercise for wt loss: Recent weather conditions.   Alice Humphrey has been educated on importance of strength training to enhance/ sustain muscle mass along with prudent nutrition  and how exercise can improve energy levels  Exercise prescription: She has agreed to Continue current level of physical activity , Increase the intensity, frequency or duration of strengthening exercises , and Increase the intensity, frequency or duration of aerobic exercises      Medical Interventions/ Pharmacotherapy Previous Bariatric surgery: none  Pharmacotherapy for weight loss: She is currently prescribed Metformin  XR for medical weight loss.   Has  discontinued use of Metformin  as she recently found out she's pregnant.    SPECIFIC behavorial / nutritional / exercise goals for next office visit:  Try not to lose muscle mass  B) OBESITY RELATED CONDITIONS ADDRESSED TODAY:  Prediabetes Assessment & Plan Lab Results  Component Value Date   HGBA1C 5.1 09/14/2024   HGBA1C 5.4 04/12/2024   HGBA1C 5.1 04/01/2022   INSULIN  13.7 09/14/2024   INSULIN  7.3 04/12/2024   Lab Results  Component Value Date   CREATININE 0.53 01/20/2025   BUN 8 01/20/2025   NA 136 01/20/2025   K 4.2 01/20/2025   CL 102 01/20/2025   CO2 25 01/20/2025      Component Value Date/Time   PROT 7.0 01/20/2025 1624   PROT 7.0 09/14/2024 0916   ALBUMIN 4.3 01/20/2025 1624   ALBUMIN 4.3 09/14/2024 0916   AST 14 (L) 01/20/2025 1624   ALT 17 01/20/2025 1624   ALKPHOS 75 01/20/2025 1624   BILITOT 0.4 01/20/2025 1624   BILITOT 0.4 09/14/2024 0916  Pre-DM condition is well-controlled. Is currently prescribed Metformin  XR 1,000 daily. Pt discontinued taking medication because she found out she is pregnant. Reports since she has been off of medication she has experienced more carb and sugar cravings. Focus on whole foods and leans proteins. Increase exercise as able. Hold off on medication right now and  follow up with OBGYN about taking medication.   Kidney function and liver enzymes are WNL. No acute concerns today. Increase water intake. Decrease fatty carbs and simple sugars. Avoid advil  and alleve.     Vitamin D  deficiency Assessment & Plan Lab Results  Component Value Date   VD25OH 50.6 09/14/2024   VD25OH 20.7 (L) 04/12/2024  Currently prescribed Ergo 50K once weekly. Vit D levels are at goal. Encouraged pt to ask OBGYN about continuing supplementation. Will refill today. Recheck levels at next OV.   Pregnancy, unspecified gestational age Hold medications until she speaks with Obstetrician to make sure its ok she continues with medications. Told her not  to take take OTC medication without first checking with her OB doctor first either. Cont adherence to nutritional meal plan and exercise.     Follow up:   Return 02/22/2025 3:20 PM.   She was informed of the importance of frequent follow up visits to maximize her success with intensive lifestyle modifications for her multiple health conditions.    Weight Summary and Biometrics   Weight Lost Since Last Visit: 2lb  Weight Gained Since Last Visit: 0lb    Vitals Temp: 98.4 F (36.9 C) BP: 116/77 Pulse Rate: 70 SpO2: 100 %   Anthropometric Measurements Height: 4' 11 (1.499 m) Weight: 159 lb (72.1 kg) BMI (Calculated): 32.1 Weight at Last Visit: 161lb Weight Lost Since Last Visit: 2lb Weight Gained Since Last Visit: 0lb Starting Weight: 196lb Total Weight Loss (lbs): 37 lb (16.8 kg) Peak Weight: 205lb   Body Composition  Body Fat %: 42 % Fat Mass (lbs): 67 lbs Muscle Mass (lbs): 87.8 lbs Total Body Water (lbs): 73 lbs Visceral Fat Rating : 8  Other Clinical Data Fasting: no Labs: no Today's Visit #: 11 Starting Date: 04/12/24    Objective:   PHYSICAL EXAM: Blood pressure 116/77, pulse 70, temperature 98.4 F (36.9 C), height 4' 11 (1.499 m), weight 159 lb (72.1 kg), last menstrual period 12/07/2024, SpO2 100%. Body mass index is 32.11 kg/m. General: she is overweight, cooperative and in no acute distress. PSYCH: Has normal mood, affect and thought process.   HEENT: EOMI, sclerae are anicteric. Lungs: Normal breathing effort, no conversational dyspnea. Extremities: Moves * 4 Neurologic: A and O * 3, good insight  DIAGNOSTIC DATA REVIEWED: BMET    Component Value Date/Time   NA 136 01/20/2025 1624   NA 140 09/14/2024 0916   K 4.2 01/20/2025 1624   CL 102 01/20/2025 1624   CO2 25 01/20/2025 1624   GLUCOSE 99 01/20/2025 1624   BUN 8 01/20/2025 1624   BUN 8 09/14/2024 0916   CREATININE 0.53 01/20/2025 1624   CALCIUM 9.2 01/20/2025 1624    GFRNONAA >60 01/20/2025 1624   GFRAA >60 08/15/2015 2321   Lab Results  Component Value Date   HGBA1C 5.1 09/14/2024   HGBA1C 5.0 07/01/2018   Lab Results  Component Value Date   INSULIN  13.7 09/14/2024   INSULIN  7.3 04/12/2024   Lab Results  Component Value Date   TSH 0.761 04/12/2024   CBC    Component Value Date/Time   WBC 12.4 (H) 01/20/2025 1625   RBC 4.22 01/20/2025 1625   HGB 13.5 01/20/2025 1625   HGB 13.1 04/12/2024 1032   HCT 39.2 01/20/2025 1625   HCT 40.8 04/12/2024 1032   PLT 314 01/20/2025 1625   PLT 332 04/12/2024 1032   MCV 92.9 01/20/2025 1625   MCV 92 04/12/2024 1032   MCH 32.0 01/20/2025 1625   MCHC 34.4 01/20/2025 1625   RDW 12.8 01/20/2025 1625   RDW 12.7 04/12/2024 1032   Iron Studies No results found for: IRON, TIBC, FERRITIN, IRONPCTSAT Lipid Panel     Component Value Date/Time   CHOL 158 09/14/2024 0916   TRIG 64 09/14/2024 0916   HDL 57 09/14/2024 0916   CHOLHDL 2.8 09/14/2024 0916   LDLCALC 88 09/14/2024 0916   Hepatic Function Panel     Component Value Date/Time   PROT 7.0 01/20/2025 1624   PROT 7.0 09/14/2024 0916   ALBUMIN 4.3 01/20/2025 1624   ALBUMIN 4.3 09/14/2024 0916   AST 14 (L) 01/20/2025 1624   ALT 17 01/20/2025 1624   ALKPHOS 75 01/20/2025 1624   BILITOT 0.4 01/20/2025 1624   BILITOT 0.4 09/14/2024 0916      Component Value Date/Time   TSH 0.761 04/12/2024 1032   Nutritional Lab Results  Component Value Date   VD25OH 50.6 09/14/2024   VD25OH 20.7 (L) 04/12/2024    Attestations:   I, ***, acting as a medical scribe for Alice Jenkins, DO., have compiled all relevant documentation for today's office visit on behalf of Alice Jenkins, DO, while in the presence of [employer], DO.  Only keep if time based coding I have spent *** minutes in the care of the patient including:   -   *** minutes before the visit reviewing and preparing the chart.   -   *** minutes face-to-face assessing and  reviewing listed medical problems as outlined in obesity care plan, providing nutritional and behavioral counseling on topics outlined in the obesity care plan, independently interpreting test results and goals of care as described in assessment and plan, and  reviewing and discussing biometric information and progress with obesity treatment plan  -   *** minutes after the visit regarding the EMR documentation of encounter.   I have reviewed the above documentation for accuracy and completeness, and I agree with the above. Alice Humphrey, D.O.  The 21st Century Cures Act was signed into law in 2016 which includes the topic of electronic health records.  This provides immediate access to information in MyChart.  This includes consultation notes, operative notes, office notes, lab results and pathology reports.  If you have any questions about what you read please let us  know at your next visit so we can discuss your concerns and take corrective action if need be.  We are right here with you.  "

## 2025-02-22 ENCOUNTER — Ambulatory Visit (INDEPENDENT_AMBULATORY_CARE_PROVIDER_SITE_OTHER): Admitting: Family Medicine
